# Patient Record
Sex: Male | Born: 1965 | Hispanic: No | Marital: Married | Smoking: Never smoker
Health system: Southern US, Community
[De-identification: ages and names within clinical notes are randomized; demographics above are authoritative.]

## PROBLEM LIST (undated history)

## (undated) DIAGNOSIS — R42 Dizziness and giddiness: Secondary | ICD-10-CM

## (undated) DIAGNOSIS — S31139A Puncture wound of abdominal wall without foreign body, unspecified quadrant without penetration into peritoneal cavity, initial encounter: Secondary | ICD-10-CM

## (undated) DIAGNOSIS — F4310 Post-traumatic stress disorder, unspecified: Secondary | ICD-10-CM

## (undated) DIAGNOSIS — R2689 Other abnormalities of gait and mobility: Secondary | ICD-10-CM

## (undated) DIAGNOSIS — I2699 Other pulmonary embolism without acute cor pulmonale: Secondary | ICD-10-CM

## (undated) DIAGNOSIS — W3400XA Accidental discharge from unspecified firearms or gun, initial encounter: Secondary | ICD-10-CM

## (undated) DIAGNOSIS — I10 Essential (primary) hypertension: Secondary | ICD-10-CM

## (undated) DIAGNOSIS — E119 Type 2 diabetes mellitus without complications: Secondary | ICD-10-CM

## (undated) HISTORY — DX: Dizziness and giddiness: R42

## (undated) HISTORY — DX: Other abnormalities of gait and mobility: R26.89

## (undated) HISTORY — DX: Essential (primary) hypertension: I10

## (undated) HISTORY — DX: Post-traumatic stress disorder, unspecified: F43.10

## (undated) HISTORY — PX: CARDIAC CATHETERIZATION: SHX172

---

## 2011-08-07 ENCOUNTER — Encounter (HOSPITAL_COMMUNITY): Payer: Self-pay | Admitting: Emergency Medicine

## 2011-08-07 ENCOUNTER — Emergency Department (HOSPITAL_COMMUNITY)
Admission: EM | Admit: 2011-08-07 | Discharge: 2011-08-07 | Disposition: A | Discharge status: Home / Self Care | Payer: Self-pay | Source: Home / Self Care | Attending: Emergency Medicine | Admitting: Emergency Medicine

## 2011-08-07 DIAGNOSIS — H7320 Unspecified myringitis, unspecified ear: Secondary | ICD-10-CM

## 2011-08-07 DIAGNOSIS — H7390 Unspecified disorder of tympanic membrane, unspecified ear: Secondary | ICD-10-CM

## 2011-08-07 DIAGNOSIS — H9311 Tinnitus, right ear: Secondary | ICD-10-CM

## 2011-08-07 DIAGNOSIS — H9319 Tinnitus, unspecified ear: Secondary | ICD-10-CM

## 2011-08-07 MED ORDER — CIPROFLOXACIN-DEXAMETHASONE 0.3-0.1 % OT SUSP: 4.0000 [drp] | Freq: Two times a day (BID) | OTIC | Status: AC

## 2011-08-07 NOTE — ED Provider Notes (Addendum)
History     CSN: [id number]  Arrival date & time 08/07/11  1714   First MD Initiated Contact with Patient 08/07/11 1717      Chief Complaint  Patient presents with  . Tinnitus    (Consider location/radiation/quality/duration/timing/severity/associated sxs/prior treatment) HPI Comments: 3 days with right ear tinnitus in minimal discomfort. Patient denies any direct injury or trauma to his ears or any ear drainage, denies any recent respiratory symptoms or allergies. Denies any fevers or pain.   Patient describes a for about 3 days he's been feeling this unusual sound emanating from his right ear. He is uncertain and has never experienced anything like this before. He also goes into explaining that he has not had any other symptoms such as vertigo, dizziness, nausea or vomiting. He thought initially that could be that his ear was filled with your legs and he was attempting to use peroxide ear drops which she used for 2-3 days. But the sensation in the discomfort persisted. Decided to come in today to be checked.  Wife describes a has been performing some you about exercise in which she blows air through his nose for minutes at a time subsequently.   The history is provided by the patient and the spouse.    Past Medical History  Diagnosis Date  . Diabetes mellitus     History reviewed. No pertinent past surgical history.  No family history on file.  History  Substance Use Topics  . Smoking status: Never Smoker   . Smokeless tobacco: Not on file  . Alcohol Use: Yes     OCASSIONALLY      Review of Systems  Constitutional: Negative for fever, chills and activity change.  HENT: Positive for tinnitus. Negative for hearing loss, nosebleeds, congestion, rhinorrhea, sneezing, neck pain, neck stiffness, dental problem, postnasal drip and sinus pressure.   Eyes: Negative for pain.  Cardiovascular: Negative for chest pain.  Neurological: Negative for dizziness, seizures, syncope,  speech difficulty, weakness, light-headedness, numbness and headaches.    Allergies  Review of patient's allergies indicates no known allergies.  Home Medications   Current Outpatient Rx  Name Route Sig Dispense Refill  . METFORMIN HCL 500 MG PO TABS Oral Take 500 mg by mouth 2 (two) times daily with a meal.    . CIPROFLOXACIN-DEXAMETHASONE 0.3-0.1 % OT SUSP Right Ear Place 4 drops into the right ear 2 (two) times daily. 7.5 mL 0    BP 135/81  Pulse 96  Temp 98.4 F (36.9 C) (Oral)  Resp 16  SpO2 97%  Physical Exam  Nursing note and vitals reviewed. Constitutional: He appears well-developed and well-nourished.  HENT:  Head: Normocephalic.  Right Ear: No lacerations. No drainage. No foreign bodies. No mastoid tenderness. Tympanic membrane is injected. Tympanic membrane is not scarred, not perforated, not retracted and not bulging. Tympanic membrane mobility is normal. No middle ear effusion. No decreased hearing is noted.  Ears:  Eyes: Conjunctivae are normal. Right eye exhibits no discharge. Left eye exhibits no discharge.  Neck: Neck supple. No JVD present.  Lymphadenopathy:    He has no cervical adenopathy.  Skin: No rash noted.    ED Course  Procedures (including critical care time)  Labs Reviewed - No data to display No results found.   1. Tinnitus of right ear   2. Tympanic membrane inflammation       MDM  Right ear tinnitus possibly secondary to secondary to eustachian tube dysfunction or para trauma related to recent your  exercise. Incidentally noted focal area of erythema in the eardrum suspect this is unrelated and incidental- will prescribed otic antibiotic topical (ear drops) x 7 days. Patient was instructed to followup with ENT Dr. if no improvement is noted in the next 5-7 days.    Jimmie Molly, MD 08/07/11 1930  Jimmie Molly, MD 08/07/11 7253
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
## 2011-08-07 NOTE — Discharge Instructions (Signed)
We discussed several things about this symptom, recommended you see the ear nose and throat doctor if no improvement after 7-10 days. Should discontinue or stop performing does your exercises that might be creating this problem.     Tinnitus Sounds you hear in your ears and coming from within the ear is called tinnitus. This can be a symptom of many ear disorders. It is often associated with hearing loss.  Tinnitus can be seen with:  Infections.   Ear blockages such as wax buildup.   Meniere's disease.   Ear damage.   Inherited.   Occupational causes.  While irritating, it is not usually a threat to health. When the cause of the tinnitus is wax, infection in the middle ear, or foreign body it is easily treated. Hearing loss will usually be reversible.  TREATMENT  When treating the underlying cause does not get rid of tinnitus, it may be necessary to get rid of the unwanted sound by covering it up with more pleasant background noises. This may include music, the radio etc. There are tinnitus maskers which can be worn which produce background noise to cover up the tinnitus. Avoid all medications which tend to make tinnitus worse such as alcohol, caffeine, aspirin, and nicotine. There are many soothing background tapes such as rain, ocean, thunderstorms, etc. These soothing sounds help with sleeping or resting. Keep all follow-up appointments and referrals. This is important to identify the cause of the problem. It also helps avoid complications, impaired hearing, disability, or chronic pain. Document Released: 02/03/2005 Document Revised: 01/23/2011 Document Reviewed: 09/22/2007 [facility] Patient Information 2012 [place], [place].

## 2012-02-18 DIAGNOSIS — S31139A Puncture wound of abdominal wall without foreign body, unspecified quadrant without penetration into peritoneal cavity, initial encounter: Secondary | ICD-10-CM

## 2012-02-18 HISTORY — DX: Puncture wound of abdominal wall without foreign body, unspecified quadrant without penetration into peritoneal cavity, initial encounter: S31.139A

## 2012-08-13 ENCOUNTER — Encounter (HOSPITAL_COMMUNITY): Payer: Self-pay | Admitting: Certified Registered Nurse Anesthetist

## 2012-08-13 ENCOUNTER — Emergency Department (HOSPITAL_COMMUNITY): Payer: BC Managed Care – PPO

## 2012-08-13 ENCOUNTER — Encounter (HOSPITAL_COMMUNITY): Admission: EM | Disposition: A | Discharge status: Home Health | Payer: Self-pay | Source: Home / Self Care

## 2012-08-13 ENCOUNTER — Inpatient Hospital Stay (HOSPITAL_COMMUNITY)
Admission: EM | Admit: 2012-08-13 | Discharge: 2012-08-24 | DRG: 585 | Disposition: A | Discharge status: Home Health | Payer: BC Managed Care – PPO | Attending: General Surgery | Admitting: General Surgery

## 2012-08-13 ENCOUNTER — Emergency Department (HOSPITAL_COMMUNITY): Payer: BC Managed Care – PPO | Admitting: Certified Registered Nurse Anesthetist

## 2012-08-13 DIAGNOSIS — T8140XA Infection following a procedure, unspecified, initial encounter: Secondary | ICD-10-CM | POA: Diagnosis not present

## 2012-08-13 DIAGNOSIS — Y838 Other surgical procedures as the cause of abnormal reaction of the patient, or of later complication, without mention of misadventure at the time of the procedure: Secondary | ICD-10-CM | POA: Diagnosis not present

## 2012-08-13 DIAGNOSIS — S36501A Unspecified injury of transverse colon, initial encounter: Principal | ICD-10-CM | POA: Diagnosis present

## 2012-08-13 DIAGNOSIS — S36408A Unspecified injury of other part of small intestine, initial encounter: Secondary | ICD-10-CM

## 2012-08-13 DIAGNOSIS — I498 Other specified cardiac arrhythmias: Secondary | ICD-10-CM | POA: Diagnosis present

## 2012-08-13 DIAGNOSIS — S36499A Other injury of unspecified part of small intestine, initial encounter: Secondary | ICD-10-CM

## 2012-08-13 DIAGNOSIS — D72829 Elevated white blood cell count, unspecified: Secondary | ICD-10-CM | POA: Diagnosis present

## 2012-08-13 DIAGNOSIS — E871 Hypo-osmolality and hyponatremia: Secondary | ICD-10-CM | POA: Diagnosis present

## 2012-08-13 DIAGNOSIS — S36509A Unspecified injury of unspecified part of colon, initial encounter: Secondary | ICD-10-CM

## 2012-08-13 DIAGNOSIS — S31139A Puncture wound of abdominal wall without foreign body, unspecified quadrant without penetration into peritoneal cavity, initial encounter: Secondary | ICD-10-CM

## 2012-08-13 DIAGNOSIS — J9600 Acute respiratory failure, unspecified whether with hypoxia or hypercapnia: Secondary | ICD-10-CM | POA: Diagnosis not present

## 2012-08-13 DIAGNOSIS — S36400A Unspecified injury of duodenum, initial encounter: Secondary | ICD-10-CM

## 2012-08-13 DIAGNOSIS — E119 Type 2 diabetes mellitus without complications: Secondary | ICD-10-CM | POA: Diagnosis present

## 2012-08-13 DIAGNOSIS — K659 Peritonitis, unspecified: Secondary | ICD-10-CM | POA: Diagnosis present

## 2012-08-13 DIAGNOSIS — S3681XA Injury of peritoneum, initial encounter: Secondary | ICD-10-CM | POA: Diagnosis present

## 2012-08-13 DIAGNOSIS — I959 Hypotension, unspecified: Secondary | ICD-10-CM | POA: Diagnosis present

## 2012-08-13 DIAGNOSIS — E876 Hypokalemia: Secondary | ICD-10-CM | POA: Diagnosis not present

## 2012-08-13 DIAGNOSIS — W3400XA Accidental discharge from unspecified firearms or gun, initial encounter: Secondary | ICD-10-CM

## 2012-08-13 DIAGNOSIS — D62 Acute posthemorrhagic anemia: Secondary | ICD-10-CM | POA: Diagnosis not present

## 2012-08-13 DIAGNOSIS — IMO0002 Reserved for concepts with insufficient information to code with codable children: Secondary | ICD-10-CM | POA: Diagnosis present

## 2012-08-13 HISTORY — DX: Type 2 diabetes mellitus without complications: E11.9

## 2012-08-13 HISTORY — PX: LAPAROTOMY: SHX154

## 2012-08-13 HISTORY — PX: BOWEL RESECTION: SHX1257

## 2012-08-13 LAB — CBC WITH DIFFERENTIAL/PLATELET
Basophils Absolute: 0 K/uL (ref 0.0–0.1)
Basophils Relative: 0 % (ref 0–1)
Eosinophils Absolute: 0.1 10*3/uL (ref 0.0–0.7)
Eosinophils Relative: 1 % (ref 0–5)
HCT: 39.6 % (ref 39.0–52.0)
Hemoglobin: 13.7 g/dL (ref 13.0–17.0)
Lymphocytes Relative: 19 % (ref 12–46)
Lymphs Abs: 2.3 K/uL (ref 0.7–4.0)
MCH: 30.2 pg (ref 26.0–34.0)
MCHC: 34.6 g/dL (ref 30.0–36.0)
MCV: 87.4 fL (ref 78.0–100.0)
Monocytes Absolute: 0.9 10*3/uL (ref 0.1–1.0)
Monocytes Relative: 7 % (ref 3–12)
Neutro Abs: 9.2 K/uL — ABNORMAL HIGH (ref 1.7–7.7)
Neutrophils Relative %: 73 % (ref 43–77)
Platelets: 262 K/uL (ref 150–400)
RBC: 4.53 MIL/uL (ref 4.22–5.81)
RDW: 12.8 % (ref 11.5–15.5)
WBC: 12.6 K/uL — ABNORMAL HIGH (ref 4.0–10.5)

## 2012-08-13 LAB — COMPREHENSIVE METABOLIC PANEL WITH GFR
ALT: 26 U/L (ref 0–53)
Albumin: 3.7 g/dL (ref 3.5–5.2)
Calcium: 8.6 mg/dL (ref 8.4–10.5)
GFR calc Af Amer: >90 mL/min (ref >90)
Glucose, Bld: 222 mg/dL — ABNORMAL HIGH (ref 70–99)
Sodium: 139 meq/L (ref 135–145)
Total Protein: 6.1 g/dL (ref 6.0–8.3)

## 2012-08-13 LAB — COMPREHENSIVE METABOLIC PANEL
AST: 26 U/L (ref 0–37)
Alkaline Phosphatase: 69 U/L (ref 39–117)
BUN: 13 mg/dL (ref 6–23)
CO2: 22 mEq/L (ref 19–32)
Chloride: 103 mEq/L (ref 96–112)
Creatinine, Ser: 0.87 mg/dL (ref 0.50–1.35)
GFR calc non Af Amer: >90 mL/min (ref >90)
Potassium: 4.2 mEq/L (ref 3.5–5.1)
Total Bilirubin: 0.2 mg/dL — ABNORMAL LOW (ref 0.3–1.2)

## 2012-08-13 LAB — POCT I-STAT, CHEM 8
BUN: 13 mg/dL (ref 6–23)
Calcium, Ion: 1.1 mmol/L — ABNORMAL LOW (ref 1.12–1.23)
Chloride: 103 meq/L (ref 96–112)
Creatinine, Ser: 0.9 mg/dL (ref 0.50–1.35)
Glucose, Bld: 230 mg/dL — ABNORMAL HIGH (ref 70–99)
HCT: 42 % (ref 39.0–52.0)
Hemoglobin: 14.3 g/dL (ref 13.0–17.0)
Potassium: 4.3 mEq/L (ref 3.5–5.1)
Sodium: 139 mEq/L (ref 135–145)
TCO2: 21 mmol/L (ref 0–100)

## 2012-08-13 LAB — PROTIME-INR
INR: 1.1 (ref 0.00–1.49)
Prothrombin Time: 14 s (ref 11.6–15.2)

## 2012-08-13 LAB — APTT: aPTT: 22 s — ABNORMAL LOW (ref 24–37)

## 2012-08-13 LAB — ABO/RH: ABO/RH(D): A POS

## 2012-08-13 SURGERY — LAPAROTOMY, EXPLORATORY
Anesthesia: General | Site: Abdomen

## 2012-08-13 MED ORDER — CEFAZOLIN SODIUM-DEXTROSE 2-3 GM-% IV SOLR
INTRAVENOUS | Status: DC | PRN
  Administered 2012-08-13: 2 g via INTRAVENOUS

## 2012-08-13 MED ORDER — ETOMIDATE 2 MG/ML IV SOLN
INTRAVENOUS | Status: DC | PRN
  Administered 2012-08-13: 20 mg via INTRAVENOUS

## 2012-08-13 MED ORDER — PROPOFOL 10 MG/ML IV EMUL
25.0000 ug/kg/min | INTRAVENOUS | Status: DC
  Filled 2012-08-13: qty 100

## 2012-08-13 MED ORDER — ROCURONIUM BROMIDE 100 MG/10ML IV SOLN
INTRAVENOUS | Status: DC | PRN
  Administered 2012-08-13: 15 mg via INTRAVENOUS
  Administered 2012-08-13: 25 mg via INTRAVENOUS
  Administered 2012-08-13: 20 mg via INTRAVENOUS
  Administered 2012-08-13: 10 mg via INTRAVENOUS

## 2012-08-13 MED ORDER — SODIUM CHLORIDE 0.9 % IV SOLN
INTRAVENOUS | Status: DC | PRN
  Administered 2012-08-13: 22:00:00 via INTRAVENOUS

## 2012-08-13 MED ORDER — 0.9 % SODIUM CHLORIDE (POUR BTL) OPTIME
TOPICAL | Status: DC | PRN
  Administered 2012-08-13: 4000 mL

## 2012-08-13 MED ORDER — SUCCINYLCHOLINE CHLORIDE 20 MG/ML IJ SOLN
INTRAMUSCULAR | Status: DC | PRN
  Administered 2012-08-13: 120 mg via INTRAVENOUS

## 2012-08-13 MED ORDER — LACTATED RINGERS IV SOLN
INTRAVENOUS | Status: DC | PRN
  Administered 2012-08-13 (×2): via INTRAVENOUS

## 2012-08-13 MED ORDER — FENTANYL CITRATE 0.05 MG/ML IJ SOLN
INTRAMUSCULAR | Status: DC | PRN
  Administered 2012-08-13 (×4): 50 ug via INTRAVENOUS
  Administered 2012-08-13 (×2): 100 ug via INTRAVENOUS
  Administered 2012-08-13: 50 ug via INTRAVENOUS
  Administered 2012-08-13 – 2012-08-14 (×2): 150 ug via INTRAVENOUS

## 2012-08-13 MED ORDER — PROPOFOL INFUSION 10 MG/ML OPTIME
INTRAVENOUS | Status: DC | PRN
  Administered 2012-08-13: 50 ug/kg/min via INTRAVENOUS

## 2012-08-13 MED ORDER — MIDAZOLAM HCL 5 MG/5ML IJ SOLN
INTRAMUSCULAR | Status: DC | PRN
  Administered 2012-08-13: 2 mg via INTRAVENOUS

## 2012-08-13 SURGICAL SUPPLY — 50 items
BLADE SURG ROTATE 9660 (MISCELLANEOUS) ×2 IMPLANT
CANISTER SUCTION 2500CC (MISCELLANEOUS) ×2 IMPLANT
CANISTER WOUND CARE 500ML ATS (WOUND CARE) ×2 IMPLANT
CHLORAPREP W/TINT 26ML (MISCELLANEOUS) IMPLANT
CLOTH BEACON ORANGE TIMEOUT ST (SAFETY) ×2 IMPLANT
COVER SURGICAL LIGHT HANDLE (MISCELLANEOUS) ×2 IMPLANT
DRAIN CHANNEL 19F RND (DRAIN) IMPLANT
DRAPE LAPAROSCOPIC ABDOMINAL (DRAPES) ×2 IMPLANT
DRAPE UTILITY 15X26 W/TAPE STR (DRAPE) ×4 IMPLANT
DRAPE WARM FLUID 44X44 (DRAPE) ×2 IMPLANT
ELECT BLADE 6.5 EXT (BLADE) ×2 IMPLANT
ELECT CAUTERY BLADE 6.4 (BLADE) ×2 IMPLANT
ELECT REM PT RETURN 9FT ADLT (ELECTROSURGICAL) ×2
ELECTRODE REM PT RTRN 9FT ADLT (ELECTROSURGICAL) ×1 IMPLANT
EVACUATOR SILICONE 100CC (DRAIN) IMPLANT
GLOVE BIO SURGEON STRL SZ7.5 (GLOVE) ×2 IMPLANT
GLOVE BIO SURGEON STRL SZ8 (GLOVE) ×4 IMPLANT
GLOVE BIOGEL PI IND STRL 6.5 (GLOVE) ×2 IMPLANT
GLOVE BIOGEL PI IND STRL 7.5 (GLOVE) ×1 IMPLANT
GLOVE BIOGEL PI IND STRL 8 (GLOVE) ×1 IMPLANT
GLOVE BIOGEL PI INDICATOR 6.5 (GLOVE) ×2
GLOVE BIOGEL PI INDICATOR 7.5 (GLOVE) ×1
GLOVE BIOGEL PI INDICATOR 8 (GLOVE) ×1
GLOVE SURG ORTHO 8.0 STRL STRW (GLOVE) ×2 IMPLANT
GLOVE SURG SS PI 6.0 STRL IVOR (GLOVE) ×4 IMPLANT
GOWN STRL NON-REIN LRG LVL3 (GOWN DISPOSABLE) ×4 IMPLANT
GOWN STRL REIN XL XLG (GOWN DISPOSABLE) ×4 IMPLANT
KIT BASIN OR (CUSTOM PROCEDURE TRAY) ×2 IMPLANT
KIT ROOM TURNOVER OR (KITS) ×2 IMPLANT
LIGASURE IMPACT 36 18CM CVD LR (INSTRUMENTS) ×2 IMPLANT
NS IRRIG 1000ML POUR BTL (IV SOLUTION) ×4 IMPLANT
PACK GENERAL/GYN (CUSTOM PROCEDURE TRAY) ×2 IMPLANT
PAD ARMBOARD 7.5X6 YLW CONV (MISCELLANEOUS) ×2 IMPLANT
RELOAD PROXIMATE 75MM BLUE (ENDOMECHANICALS) ×12 IMPLANT
SPECIMEN JAR LARGE (MISCELLANEOUS) IMPLANT
SPONGE ABDOMINAL VAC ABTHERA (MISCELLANEOUS) ×2 IMPLANT
SPONGE LAP 18X18 X RAY DECT (DISPOSABLE) ×8 IMPLANT
STAPLER GUN LINEAR PROX 60 (STAPLE) ×2 IMPLANT
STAPLER PROXIMATE 75MM BLUE (STAPLE) ×4 IMPLANT
STAPLER VISISTAT 35W (STAPLE) ×2 IMPLANT
SUCTION POOLE TIP (SUCTIONS) ×2 IMPLANT
SUT PDS AB 1 TP1 96 (SUTURE) IMPLANT
SUT SILK 2 0 SH CR/8 (SUTURE) ×6 IMPLANT
SUT SILK 2 0 TIES 10X30 (SUTURE) IMPLANT
SUT SILK 3 0 SH CR/8 (SUTURE) ×4 IMPLANT
SUT SILK 3 0 TIES 10X30 (SUTURE) IMPLANT
TOWEL OR 17X26 10 PK STRL BLUE (TOWEL DISPOSABLE) ×2 IMPLANT
TRAY FOLEY CATH 14FRSI W/METER (CATHETERS) ×2 IMPLANT
WATER STERILE IRR 1000ML POUR (IV SOLUTION) IMPLANT
YANKAUER SUCT BULB TIP NO VENT (SUCTIONS) ×2 IMPLANT
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
## 2012-08-13 NOTE — ED Notes (Signed)
X-ray at bedside
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
## 2012-08-13 NOTE — ED Notes (Signed)
[facility] END

## 2012-08-13 NOTE — ED Notes (Signed)
Pt transported to OR

## 2012-08-13 NOTE — Anesthesia Procedure Notes (Signed)
Procedure Name: Intubation Date/Time: 08/13/2012 9:44 PM Performed by: Julianne Rice Z Pre-anesthesia Checklist: Patient identified, Timeout performed, Emergency Drugs available, Suction available and Patient being monitored Patient Re-evaluated:Patient Re-evaluated prior to inductionOxygen Delivery Method: Circle system utilized Preoxygenation: Pre-oxygenation with 100% oxygen Intubation Type: IV induction, Rapid sequence and Cricoid Pressure applied Laryngoscope Size: Mac and 3 Grade View: Grade I Tube type: Oral Tube size: 8.0 mm Number of attempts: 1 Airway Equipment and Method: Stylet Placement Confirmation: ETT inserted through vocal cords under direct vision,  breath sounds checked- equal and bilateral and positive ETCO2 Secured at: 22 cm Tube secured with: Tape Dental Injury: Teeth and Oropharynx as per pre-operative assessment
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
## 2012-08-14 ENCOUNTER — Inpatient Hospital Stay (HOSPITAL_COMMUNITY): Payer: BC Managed Care – PPO

## 2012-08-14 DIAGNOSIS — S31109A Unspecified open wound of abdominal wall, unspecified quadrant without penetration into peritoneal cavity, initial encounter: Secondary | ICD-10-CM

## 2012-08-14 LAB — LIPASE, BLOOD: Lipase: 246 U/L — ABNORMAL HIGH (ref 11–59)

## 2012-08-14 LAB — POCT I-STAT 4, (NA,K, GLUC, HGB,HCT)
Glucose, Bld: 223 mg/dL — ABNORMAL HIGH (ref 70–99)
HCT: 27 % — ABNORMAL LOW (ref 39.0–52.0)
Hemoglobin: 9.2 g/dL — ABNORMAL LOW (ref 13.0–17.0)
Potassium: 4.2 meq/L (ref 3.5–5.1)

## 2012-08-14 LAB — BASIC METABOLIC PANEL
BUN: 12 mg/dL (ref 6–23)
BUN: 13 mg/dL (ref 6–23)
CO2: 23 mEq/L (ref 19–32)
Calcium: 7.3 mg/dL — ABNORMAL LOW (ref 8.4–10.5)
Chloride: 104 mEq/L (ref 96–112)
Glucose, Bld: 218 mg/dL — ABNORMAL HIGH (ref 70–99)
Glucose, Bld: 140 mg/dL — ABNORMAL HIGH (ref 70–99)
Potassium: 4.1 mEq/L (ref 3.5–5.1)
Potassium: 3.7 mEq/L (ref 3.5–5.1)
Sodium: 138 mEq/L (ref 135–145)

## 2012-08-14 LAB — BLOOD GAS, ARTERIAL
Acid-base deficit: 3.1 mmol/L — ABNORMAL HIGH (ref 0.0–2.0)
Acid-base deficit: 1.6 mmol/L (ref 0.0–2.0)
Bicarbonate: 22.4 mEq/L (ref 20.0–24.0)
Drawn by: 33234
FIO2: 1 %
FIO2: 0.4 %
MECHVT: 500 mL
O2 Saturation: 99.7 %
Patient temperature: 98.6
RATE: 15 resp/min
TCO2: 23.5 mmol/L (ref 0–100)
pCO2 arterial: 51.6 mmHg — ABNORMAL HIGH (ref 35.0–45.0)
pH, Arterial: 7.268 — ABNORMAL LOW (ref 7.350–7.450)

## 2012-08-14 LAB — PREPARE FRESH FROZEN PLASMA: Unit division: 0

## 2012-08-14 LAB — GLUCOSE, CAPILLARY
Glucose-Capillary: 112 mg/dL — ABNORMAL HIGH (ref 70–99)
Glucose-Capillary: 120 mg/dL — ABNORMAL HIGH (ref 70–99)

## 2012-08-14 LAB — POCT I-STAT 7, (LYTES, BLD GAS, ICA,H+H)
Acid-base deficit: 2 mmol/L (ref 0.0–2.0)
Calcium, Ion: 1.09 mmol/L — ABNORMAL LOW (ref 1.12–1.23)
O2 Saturation: 100 %
Potassium: 4 meq/L (ref 3.5–5.1)
Sodium: 138 meq/L (ref 135–145)
pCO2 arterial: 40.3 mmHg (ref 35.0–45.0)

## 2012-08-14 LAB — CBC
HCT: 37.1 % — ABNORMAL LOW (ref 39.0–52.0)
HCT: 35.5 % — ABNORMAL LOW (ref 39.0–52.0)
Hemoglobin: 12.9 g/dL — ABNORMAL LOW (ref 13.0–17.0)
Hemoglobin: 12.4 g/dL — ABNORMAL LOW (ref 13.0–17.0)
MCH: 30.3 pg (ref 26.0–34.0)
MCH: 29.4 pg (ref 26.0–34.0)
MCHC: 34.1 g/dL (ref 30.0–36.0)
MCV: 86.6 fL (ref 78.0–100.0)
MCV: 86.1 fL (ref 78.0–100.0)
Platelets: 156 10*3/uL (ref 150–400)
RBC: 4.26 MIL/uL (ref 4.22–5.81)
RDW: 13.3 % (ref 11.5–15.5)
WBC: 5.5 10*3/uL (ref 4.0–10.5)

## 2012-08-14 LAB — PROTIME-INR: INR: 1.19 (ref 0.00–1.49)

## 2012-08-14 LAB — AMYLASE: Amylase: 131 U/L — ABNORMAL HIGH (ref 0–105)

## 2012-08-14 LAB — MRSA PCR SCREENING: MRSA by PCR: NEGATIVE

## 2012-08-14 MED ORDER — PANTOPRAZOLE SODIUM 40 MG IV SOLR
40.0000 mg | Freq: Every day | INTRAVENOUS | Status: DC
  Administered 2012-08-14 – 2012-08-19 (×5): 40 mg via INTRAVENOUS
  Filled 2012-08-14 (×10): qty 40

## 2012-08-14 MED ORDER — PANTOPRAZOLE SODIUM 40 MG PO TBEC
40.0000 mg | DELAYED_RELEASE_TABLET | Freq: Every day | ORAL | Status: DC
  Administered 2012-08-20 – 2012-08-24 (×5): 40 mg via ORAL
  Filled 2012-08-14 (×5): qty 1

## 2012-08-14 MED ORDER — ACETAMINOPHEN 10 MG/ML IV SOLN
1000.0000 mg | Freq: Four times a day (QID) | INTRAVENOUS | Status: AC | PRN
  Administered 2012-08-14 – 2012-08-15 (×2): 1000 mg via INTRAVENOUS
  Filled 2012-08-14 (×2): qty 100

## 2012-08-14 MED ORDER — INSULIN ASPART 100 UNIT/ML ~~LOC~~ SOLN
0.0000 [IU] | SUBCUTANEOUS | Status: DC
  Administered 2012-08-14: 2 [IU] via SUBCUTANEOUS
  Administered 2012-08-15 – 2012-08-17 (×2): 1 [IU] via SUBCUTANEOUS

## 2012-08-14 MED ORDER — SODIUM CHLORIDE 0.9 % IV BOLUS (SEPSIS)
1000.0000 mL | Freq: Once | INTRAVENOUS | Status: AC
  Administered 2012-08-14: 1000 mL via INTRAVENOUS

## 2012-08-14 MED ORDER — SODIUM CHLORIDE 0.9 % IV SOLN
25.0000 ug/h | INTRAVENOUS | Status: DC
  Administered 2012-08-14: 50 ug/h via INTRAVENOUS
  Administered 2012-08-14: 150 ug/h via INTRAVENOUS
  Administered 2012-08-15: 11:00:00 via INTRAVENOUS
  Administered 2012-08-16: 125 ug/h via INTRAVENOUS
  Filled 2012-08-14 (×4): qty 50

## 2012-08-14 MED ORDER — FENTANYL CITRATE 0.05 MG/ML IJ SOLN
INTRAMUSCULAR | Status: AC
  Administered 2012-08-14: 100 ug
  Filled 2012-08-14: qty 2

## 2012-08-14 MED ORDER — CHLORHEXIDINE GLUCONATE 0.12 % MT SOLN
15.0000 mL | Freq: Two times a day (BID) | OROMUCOSAL | Status: DC
  Administered 2012-08-14 – 2012-08-17 (×6): 15 mL via OROMUCOSAL
  Filled 2012-08-14 (×8): qty 15

## 2012-08-14 MED ORDER — ONDANSETRON HCL 4 MG/2ML IJ SOLN: 4.0000 mg | Freq: Four times a day (QID) | INTRAMUSCULAR | Status: DC | PRN

## 2012-08-14 MED ORDER — PROPOFOL 10 MG/ML IV EMUL
5.0000 ug/kg/min | INTRAVENOUS | Status: DC
  Administered 2012-08-14 (×2): 50 ug/kg/min via INTRAVENOUS
  Administered 2012-08-14: 35 ug/kg/min via INTRAVENOUS
  Administered 2012-08-14: 20 ug/kg/min via INTRAVENOUS
  Administered 2012-08-14: 25 ug/kg/min via INTRAVENOUS
  Administered 2012-08-15: 11:00:00 via INTRAVENOUS
  Administered 2012-08-15: 40 ug/kg/min via INTRAVENOUS
  Administered 2012-08-15: 50 ug/kg/min via INTRAVENOUS
  Administered 2012-08-16 (×2): 30 ug/kg/min via INTRAVENOUS
  Filled 2012-08-14 (×8): qty 100

## 2012-08-14 MED ORDER — CHLORHEXIDINE GLUCONATE 0.12 % MT SOLN
OROMUCOSAL | Status: AC
  Administered 2012-08-14: 15 mL
  Filled 2012-08-14: qty 15

## 2012-08-14 MED ORDER — FENTANYL BOLUS VIA INFUSION
25.0000 ug | Freq: Four times a day (QID) | INTRAVENOUS | Status: DC | PRN
  Administered 2012-08-15 (×6): 100 ug via INTRAVENOUS
  Filled 2012-08-14: qty 100

## 2012-08-14 MED ORDER — ONDANSETRON HCL 4 MG PO TABS: 4.0000 mg | ORAL_TABLET | Freq: Four times a day (QID) | ORAL | Status: DC | PRN

## 2012-08-14 MED ORDER — POTASSIUM CHLORIDE 2 MEQ/ML IV SOLN
INTRAVENOUS | Status: DC
  Administered 2012-08-14 – 2012-08-15 (×4): via INTRAVENOUS
  Filled 2012-08-14 (×6): qty 1000

## 2012-08-14 MED ORDER — BIOTENE DRY MOUTH MT LIQD
15.0000 mL | Freq: Four times a day (QID) | OROMUCOSAL | Status: DC
  Administered 2012-08-15 – 2012-08-19 (×18): 15 mL via OROMUCOSAL

## 2012-08-14 NOTE — Progress Notes (Signed)
Patient ID: Ray Clark, male   DOB: January 28, 1966, 47 y.o.   MRN: [id number] I spoke to his wife at the bedside and updated her on his clinical situation amd the plan for further surgery. Violeta Gelinas, MD, MPH, FACS Pager: [phone]
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
## 2012-08-14 NOTE — Transfer of Care (Signed)
Immediate Anesthesia Transfer of Care Note  Patient: Ray Clark  Procedure(s) Performed: Procedure(s): EXPLORATORY LAPAROTOMY (N/A) SMALL BOWEL RESECTION (N/A)  Patient Location: ICU and NICU  Anesthesia Type:General  Level of Consciousness: Patient remains intubated per anesthesia plan  Airway & Oxygen Therapy: Patient remains intubated per anesthesia plan and Patient placed on Ventilator (see vital sign flow sheet for setting)  Post-op Assessment: Post -op Vital signs reviewed and stable  Post vital signs: Reviewed and stable  Complications: No apparent anesthesia complications
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
## 2012-08-15 ENCOUNTER — Encounter (HOSPITAL_COMMUNITY): Payer: Self-pay | Admitting: Anesthesiology

## 2012-08-15 ENCOUNTER — Inpatient Hospital Stay (HOSPITAL_COMMUNITY): Payer: BC Managed Care – PPO | Admitting: Anesthesiology

## 2012-08-15 ENCOUNTER — Encounter (HOSPITAL_COMMUNITY): Admission: EM | Disposition: A | Discharge status: Home Health | Payer: Self-pay | Source: Home / Self Care

## 2012-08-15 ENCOUNTER — Encounter (HOSPITAL_COMMUNITY): Payer: Self-pay | Admitting: *Deleted

## 2012-08-15 DIAGNOSIS — J95821 Acute postprocedural respiratory failure: Secondary | ICD-10-CM

## 2012-08-15 DIAGNOSIS — E871 Hypo-osmolality and hyponatremia: Secondary | ICD-10-CM

## 2012-08-15 DIAGNOSIS — E876 Hypokalemia: Secondary | ICD-10-CM

## 2012-08-15 HISTORY — PX: LAPAROTOMY: SHX154

## 2012-08-15 HISTORY — PX: COLOSTOMY: SHX63

## 2012-08-15 HISTORY — PX: GASTROJEJUNOSTOMY: SHX1697

## 2012-08-15 LAB — POCT I-STAT 7, (LYTES, BLD GAS, ICA,H+H)
Calcium, Ion: 1.09 mmol/L — ABNORMAL LOW (ref 1.12–1.23)
Hemoglobin: 11.6 g/dL — ABNORMAL LOW (ref 13.0–17.0)
O2 Saturation: 97 %
Patient temperature: 104.8
Potassium: 3.9 meq/L (ref 3.5–5.1)
pCO2 arterial: 37.9 mmHg (ref 35.0–45.0)
pH, Arterial: 7.312 — ABNORMAL LOW (ref 7.350–7.450)

## 2012-08-15 LAB — CBC
HCT: 28.2 % — ABNORMAL LOW (ref 39.0–52.0)
Hemoglobin: 9.9 g/dL — ABNORMAL LOW (ref 13.0–17.0)
MCHC: 35.1 g/dL (ref 30.0–36.0)
MCV: 87 fL (ref 78.0–100.0)
RDW: 13.5 % (ref 11.5–15.5)

## 2012-08-15 LAB — BASIC METABOLIC PANEL
BUN: 12 mg/dL (ref 6–23)
Creatinine, Ser: 0.8 mg/dL (ref 0.50–1.35)
GFR calc Af Amer: >90 mL/min (ref >90)
GFR calc non Af Amer: >90 mL/min (ref >90)
Glucose, Bld: 107 mg/dL — ABNORMAL HIGH (ref 70–99)

## 2012-08-15 LAB — GLUCOSE, CAPILLARY
Glucose-Capillary: 103 mg/dL — ABNORMAL HIGH (ref 70–99)
Glucose-Capillary: 114 mg/dL — ABNORMAL HIGH (ref 70–99)
Glucose-Capillary: 86 mg/dL (ref 70–99)
Glucose-Capillary: 93 mg/dL (ref 70–99)

## 2012-08-15 LAB — POCT I-STAT 3, ART BLOOD GAS (G3+)
Patient temperature: 104.8
pH, Arterial: 7.29 — ABNORMAL LOW (ref 7.350–7.450)

## 2012-08-15 SURGERY — LAPAROTOMY, EXPLORATORY
Anesthesia: General | Site: Abdomen | Wound class: Clean Contaminated

## 2012-08-15 MED ORDER — METOPROLOL TARTRATE 1 MG/ML IV SOLN
5.0000 mg | INTRAVENOUS | Status: DC | PRN
  Administered 2012-08-15: 5 mg via INTRAVENOUS
  Filled 2012-08-15: qty 5

## 2012-08-15 MED ORDER — DEXTROSE 5 % IV SOLN
1.0000 g | Freq: Four times a day (QID) | INTRAVENOUS | Status: DC
  Administered 2012-08-15: 1 g via INTRAVENOUS
  Filled 2012-08-15: qty 1

## 2012-08-15 MED ORDER — SODIUM CHLORIDE 0.9 % IJ SOLN
10.0000 mL | INTRAMUSCULAR | Status: DC | PRN
  Administered 2012-08-20 – 2012-08-21 (×3): 10 mL

## 2012-08-15 MED ORDER — LACTATED RINGERS IV SOLN
INTRAVENOUS | Status: DC | PRN
  Administered 2012-08-15: 10:00:00 via INTRAVENOUS

## 2012-08-15 MED ORDER — ACETAMINOPHEN 10 MG/ML IV SOLN
1000.0000 mg | Freq: Four times a day (QID) | INTRAVENOUS | Status: AC
  Administered 2012-08-15 – 2012-08-16 (×4): 1000 mg via INTRAVENOUS
  Filled 2012-08-15 (×4): qty 100

## 2012-08-15 MED ORDER — SODIUM CHLORIDE 0.9 % IV BOLUS (SEPSIS)
1000.0000 mL | Freq: Once | INTRAVENOUS | Status: AC
  Administered 2012-08-15: 1000 mL via INTRAVENOUS

## 2012-08-15 MED ORDER — SODIUM CHLORIDE 0.9 % IV SOLN
INTRAVENOUS | Status: DC
  Administered 2012-08-15 – 2012-08-22 (×14): via INTRAVENOUS
  Filled 2012-08-15 (×22): qty 1000

## 2012-08-15 MED ORDER — POTASSIUM CHLORIDE 10 MEQ/100ML IV SOLN
10.0000 meq | INTRAVENOUS | Status: AC
  Administered 2012-08-15 (×2): 10 meq via INTRAVENOUS
  Filled 2012-08-15 (×2): qty 100

## 2012-08-15 MED ORDER — CEFAZOLIN SODIUM 1-5 GM-% IV SOLN
INTRAVENOUS | Status: AC
  Filled 2012-08-15: qty 100

## 2012-08-15 MED ORDER — PIPERACILLIN-TAZOBACTAM 3.375 G IVPB
3.3750 g | Freq: Three times a day (TID) | INTRAVENOUS | Status: AC
  Administered 2012-08-15 – 2012-08-22 (×21): 3.375 g via INTRAVENOUS
  Filled 2012-08-15 (×22): qty 50

## 2012-08-15 MED ORDER — PHENYLEPHRINE HCL 10 MG/ML IJ SOLN
10.0000 mg | INTRAVENOUS | Status: DC | PRN
  Administered 2012-08-15: 40 ug/min via INTRAVENOUS

## 2012-08-15 MED ORDER — METOPROLOL TARTRATE 1 MG/ML IV SOLN: 5.0000 mg | Freq: Once | INTRAVENOUS | Status: AC

## 2012-08-15 MED ORDER — MIDAZOLAM HCL 5 MG/5ML IJ SOLN
INTRAMUSCULAR | Status: DC | PRN
  Administered 2012-08-15: 4 mg via INTRAVENOUS

## 2012-08-15 MED ORDER — METOPROLOL TARTRATE 1 MG/ML IV SOLN
INTRAVENOUS | Status: AC
  Administered 2012-08-15: 5 mg via INTRAVENOUS
  Filled 2012-08-15: qty 5

## 2012-08-15 MED ORDER — VECURONIUM BROMIDE 10 MG IV SOLR
INTRAVENOUS | Status: DC | PRN
  Administered 2012-08-15 (×3): 5 mg via INTRAVENOUS

## 2012-08-15 MED ORDER — METHOCARBAMOL 100 MG/ML IJ SOLN
1000.0000 mg | Freq: Three times a day (TID) | INTRAVENOUS | Status: DC | PRN
  Administered 2012-08-15: 1000 mg via INTRAVENOUS
  Filled 2012-08-15: qty 10

## 2012-08-15 SURGICAL SUPPLY — 48 items
BANDAGE GAUZE ELAST BULKY 4 IN (GAUZE/BANDAGES/DRESSINGS) ×2 IMPLANT
BLADE SURG ROTATE 9660 (MISCELLANEOUS) IMPLANT
CANISTER SUCTION 2500CC (MISCELLANEOUS) ×4 IMPLANT
CHLORAPREP W/TINT 26ML (MISCELLANEOUS) IMPLANT
CLOTH BEACON ORANGE TIMEOUT ST (SAFETY) ×2 IMPLANT
COVER SURGICAL LIGHT HANDLE (MISCELLANEOUS) ×2 IMPLANT
DRAIN CHANNEL 19F RND (DRAIN) ×4 IMPLANT
DRAPE LAPAROSCOPIC ABDOMINAL (DRAPES) ×2 IMPLANT
DRAPE UTILITY 15X26 W/TAPE STR (DRAPE) ×4 IMPLANT
DRAPE WARM FLUID 44X44 (DRAPE) ×2 IMPLANT
ELECT BLADE 6.5 EXT (BLADE) ×2 IMPLANT
ELECT CAUTERY BLADE 6.4 (BLADE) ×2 IMPLANT
ELECT REM PT RETURN 9FT ADLT (ELECTROSURGICAL) ×2
ELECTRODE REM PT RTRN 9FT ADLT (ELECTROSURGICAL) ×1 IMPLANT
EVACUATOR SILICONE 100CC (DRAIN) ×4 IMPLANT
GLOVE BIO SURGEON STRL SZ8 (GLOVE) ×2 IMPLANT
GLOVE BIOGEL PI IND STRL 8 (GLOVE) ×1 IMPLANT
GLOVE BIOGEL PI INDICATOR 8 (GLOVE) ×1
GLOVE SURG SIGNA 7.5 PF LTX (GLOVE) ×2 IMPLANT
GOWN STRL NON-REIN LRG LVL3 (GOWN DISPOSABLE) ×4 IMPLANT
GOWN STRL REIN XL XLG (GOWN DISPOSABLE) ×4 IMPLANT
KIT BASIN OR (CUSTOM PROCEDURE TRAY) ×2 IMPLANT
KIT OSTOMY DRAINABLE 2.75 STR (WOUND CARE) ×2 IMPLANT
KIT ROOM TURNOVER OR (KITS) ×2 IMPLANT
LIGASURE IMPACT 36 18CM CVD LR (INSTRUMENTS) ×2 IMPLANT
NS IRRIG 1000ML POUR BTL (IV SOLUTION) ×8 IMPLANT
PACK GENERAL/GYN (CUSTOM PROCEDURE TRAY) ×2 IMPLANT
PAD ARMBOARD 7.5X6 YLW CONV (MISCELLANEOUS) ×2 IMPLANT
SPECIMEN JAR LARGE (MISCELLANEOUS) IMPLANT
SPONGE GAUZE 4X4 12PLY (GAUZE/BANDAGES/DRESSINGS) ×4 IMPLANT
SPONGE LAP 18X18 X RAY DECT (DISPOSABLE) IMPLANT
STAPLER GUN LINEAR PROX 60 (STAPLE) ×2 IMPLANT
STAPLER PROXIMATE 75MM BLUE (STAPLE) ×2 IMPLANT
STAPLER VISISTAT 35W (STAPLE) ×2 IMPLANT
SUCTION POOLE TIP (SUCTIONS) ×2 IMPLANT
SUT ETHILON 2 0 FS 18 (SUTURE) ×4 IMPLANT
SUT PDS AB 1 TP1 96 (SUTURE) ×6 IMPLANT
SUT SILK 2 0 SH CR/8 (SUTURE) ×6 IMPLANT
SUT SILK 2 0 TIES 10X30 (SUTURE) ×2 IMPLANT
SUT SILK 3 0 SH CR/8 (SUTURE) ×2 IMPLANT
SUT SILK 3 0 TIES 10X30 (SUTURE) ×2 IMPLANT
SUT VIC AB 3-0 SH 18 (SUTURE) ×2 IMPLANT
TAPE CLOTH SURG 4X10 WHT LF (GAUZE/BANDAGES/DRESSINGS) ×4 IMPLANT
TAPE UMBILICAL COTTON 1/8X30 (MISCELLANEOUS) ×4 IMPLANT
TOWEL OR 17X26 10 PK STRL BLUE (TOWEL DISPOSABLE) ×2 IMPLANT
TRAY FOLEY CATH 14FRSI W/METER (CATHETERS) IMPLANT
WATER STERILE IRR 1000ML POUR (IV SOLUTION) IMPLANT
YANKAUER SUCT BULB TIP NO VENT (SUCTIONS) IMPLANT
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
## 2012-08-15 NOTE — Anesthesia Postprocedure Evaluation (Signed)
  Anesthesia Post-op Note  Patient: Ray Clark  Procedure(s) Performed: Procedure(s): EXPLORATORY LAPAROTOMY (N/A) GASTROJEJUNOSTOMY (N/A) COLOSTOMY (N/A)  Patient Location: ICU  Anesthesia Type:General  Level of Consciousness: sedated, unresponsive and Patient remains intubated per anesthesia plan  Airway and Oxygen Therapy: Patient remains intubated per anesthesia plan  Post-op Pain: sedated unable to evaluate  Post-op Assessment: Post-op Vital signs reviewed, Patient's Cardiovascular Status Stable, Respiratory Function Stable, Patent Airway and No signs of Nausea or vomiting  Post-op Vital Signs: Reviewed and stable  Complications: No apparent anesthesia complications
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
## 2012-08-15 NOTE — Progress Notes (Signed)
Patient placed on cpap;psv 5/5-patient done well but did not leave on this because patient is going back to OR this am

## 2012-08-15 NOTE — Progress Notes (Signed)
Patient ID: Ray Clark, male   DOB: 1965/10/21, 47 y.o.   MRN: [id number] Patient has high fever.  I asked anesthesia to evaluate and they have R/O malignant hyperthermia. ABG and K are OK. Will give IV tylenol and zosyn.  Change to temp foley. Use cooling blanket. Violeta Gelinas, MD, MPH, FACS Pager: [phone]
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
## 2012-08-15 NOTE — Progress Notes (Signed)
Pt noted to be anxious with c/o increased abdominal pain at approx 0020.  Pt remains on propofol and fentanyl infusions and was given bolus doses of each to provide anxiety/pain relief.  SBP increased to 190s and HR to 150.  Pt's pain improved gradually over the next 20 minutes but his SBP decreased to 70s while his HR maintained 130s.  Propofol and fentanyl drips paused temporarily until pt's SBP returns to baseline.   MD Magnus Ivan) notified and order given for pt to receive NS 1 liter bolus over one hour.  Will continue to monitor.

## 2012-08-15 NOTE — Preoperative (Addendum)
Beta Blockers   Reason not to administer Beta Blockers:Pt. not on beta blocker 
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
## 2012-08-15 NOTE — Progress Notes (Signed)
Patient received from OR and placed back on vent. RT will continue to monitor
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
## 2012-08-15 NOTE — Progress Notes (Signed)
Peripherally Inserted Central Catheter/Midline Placement  The IV Nurse has discussed with the patient and/or persons authorized to consent for the patient, the purpose of this procedure and the potential benefits and risks involved with this procedure.  The benefits include less needle sticks, lab draws from the catheter and patient may be discharged home with the catheter.  Risks include, but not limited to, infection, bleeding, blood clot (thrombus formation), and puncture of an artery; nerve damage and irregular heat beat.  Alternatives to this procedure were also discussed.  PICC/Midline Placement Documentation  PICC Triple Lumen 08/15/12 PICC Right Basilic (Active)  Indication for Insertion or Continuance of Line Prolonged intravenous therapies 08/15/2012  5:25 PM  Length mark (cm) 1 cm 08/15/2012  5:25 PM  Site Assessment Clean;Dry;Intact 08/15/2012  5:25 PM  Lumen #1 Status Flushed;Saline locked;Blood return noted 08/15/2012  5:25 PM  Lumen #2 Status Flushed;Saline locked;Blood return noted 08/15/2012  5:25 PM  Lumen #3 Status Flushed;Saline locked;Blood return noted 08/15/2012  5:25 PM  Dressing Type Transparent 08/15/2012  5:25 PM  Dressing Status Clean;Dry;Intact;Antimicrobial disc in place 08/15/2012  5:25 PM  Line Care Connections checked and tightened 08/15/2012  5:25 PM  Dressing Intervention New dressing 08/15/2012  5:25 PM  Dressing Change Due 08/22/12 08/15/2012  5:25 PM       Geoffery Spruce 08/15/2012, 5:26 PM

## 2012-08-15 NOTE — Transfer of Care (Signed)
Immediate Anesthesia Transfer of Care Note  Patient: Ray Clark  Procedure(s) Performed: Procedure(s): EXPLORATORY LAPAROTOMY (N/A) GASTROJEJUNOSTOMY (N/A) COLOSTOMY (N/A)  Patient Location: ICU  Anesthesia Type:General  Level of Consciousness: sedated, unresponsive and Patient remains intubated per anesthesia plan  Airway & Oxygen Therapy: Patient remains intubated per anesthesia plan and Patient placed on Ventilator (see vital sign flow sheet for setting)  Post-op Assessment: Report given to PACU RN and Post -op Vital signs reviewed and stable  Post vital signs: Reviewed and stable  Complications: No apparent anesthesia complications

## 2012-08-15 NOTE — Anesthesia Procedure Notes (Signed)
Date/Time: 08/15/2012 9:40 AM Performed by: Alanda Amass A Pre-anesthesia Checklist: Patient identified, Timeout performed, Emergency Drugs available, Suction available and Patient being monitored Patient Re-evaluated:Patient Re-evaluated prior to inductionOxygen Delivery Method: Circle system utilized Intubation Type: Inhalational induction with existing ETT

## 2012-08-16 ENCOUNTER — Inpatient Hospital Stay (HOSPITAL_COMMUNITY): Payer: BC Managed Care – PPO

## 2012-08-16 ENCOUNTER — Encounter (HOSPITAL_COMMUNITY): Payer: Self-pay | Admitting: General Surgery

## 2012-08-16 LAB — CBC
Hemoglobin: 10.5 g/dL — ABNORMAL LOW (ref 13.0–17.0)
MCHC: 35.2 g/dL (ref 30.0–36.0)
WBC: 4.6 10*3/uL (ref 4.0–10.5)

## 2012-08-16 LAB — POCT I-STAT 3, ART BLOOD GAS (G3+)
O2 Saturation: 98 %
pCO2 arterial: 36.2 mmHg (ref 35.0–45.0)
pO2, Arterial: 124 mmHg — ABNORMAL HIGH (ref 80.0–100.0)

## 2012-08-16 LAB — TYPE AND SCREEN
Unit division: 0
Unit division: 0

## 2012-08-16 LAB — BASIC METABOLIC PANEL
Chloride: 105 mEq/L (ref 96–112)
GFR calc Af Amer: >90 mL/min (ref >90)
GFR calc non Af Amer: >90 mL/min (ref >90)
Glucose, Bld: 120 mg/dL — ABNORMAL HIGH (ref 70–99)
Potassium: 3.7 mEq/L (ref 3.5–5.1)
Sodium: 132 mEq/L — ABNORMAL LOW (ref 135–145)

## 2012-08-16 LAB — GLUCOSE, CAPILLARY
Glucose-Capillary: 101 mg/dL — ABNORMAL HIGH (ref 70–99)
Glucose-Capillary: 103 mg/dL — ABNORMAL HIGH (ref 70–99)

## 2012-08-16 MED ORDER — MORPHINE SULFATE 2 MG/ML IJ SOLN
INTRAMUSCULAR | Status: AC
  Administered 2012-08-16: 2 mg via INTRAVENOUS
  Filled 2012-08-16: qty 1

## 2012-08-16 MED ORDER — MORPHINE SULFATE 2 MG/ML IJ SOLN
2.0000 mg | INTRAMUSCULAR | Status: DC | PRN
  Administered 2012-08-16 (×2): 4 mg via INTRAVENOUS
  Administered 2012-08-16 – 2012-08-17 (×9): 2 mg via INTRAVENOUS
  Administered 2012-08-17 (×2): 4 mg via INTRAVENOUS
  Administered 2012-08-17 – 2012-08-18 (×6): 2 mg via INTRAVENOUS
  Administered 2012-08-18: 4 mg via INTRAVENOUS
  Filled 2012-08-16 (×3): qty 1
  Filled 2012-08-16: qty 2
  Filled 2012-08-16 (×2): qty 1
  Filled 2012-08-16: qty 2
  Filled 2012-08-16 (×5): qty 1
  Filled 2012-08-16 (×2): qty 2
  Filled 2012-08-16: qty 1
  Filled 2012-08-16 (×2): qty 2
  Filled 2012-08-16 (×3): qty 1

## 2012-08-16 MED ORDER — SODIUM BICARBONATE 8.4 % IV SOLN
INTRAVENOUS | Status: AC
  Administered 2012-08-16: 50 meq via INTRAVENOUS
  Filled 2012-08-16: qty 50

## 2012-08-16 MED ORDER — SODIUM BICARBONATE 8.4 % IV SOLN: 50.0000 meq | Freq: Once | INTRAVENOUS | Status: AC

## 2012-08-16 MED ORDER — ACETAMINOPHEN 10 MG/ML IV SOLN
1000.0000 mg | Freq: Four times a day (QID) | INTRAVENOUS | Status: AC
  Administered 2012-08-16 – 2012-08-17 (×4): 1000 mg via INTRAVENOUS
  Filled 2012-08-16 (×7): qty 100

## 2012-08-16 MED ORDER — SODIUM CHLORIDE 0.9 % IV SOLN
500.0000 mL | Freq: Once | INTRAVENOUS | Status: AC
  Administered 2012-08-16: 500 mL via INTRAVENOUS

## 2012-08-16 NOTE — Progress Notes (Signed)
UR completed 
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
## 2012-08-16 NOTE — Procedures (Signed)
Extubation Procedure Note  Patient Details:   Name: Ray Clark DOB: [birth year] MRN: [id number]   Airway Documentation:     Evaluation  O2 sats: stable throughout Complications: No apparent complications Patient did tolerate procedure well. Bilateral Breath Sounds: Clear Suctioning: Airway Yes  Pt tolerated wean well, positive for cuff leak, extubated to 4lpm [place]. No stridor or dyspnea noted after extubation. Pt appearing more comfortable at this time, and all vitals are within normal limits. RT will continue to monitor.   Beatris Si 08/16/2012, 10:16 AM

## 2012-08-17 ENCOUNTER — Encounter (HOSPITAL_COMMUNITY): Payer: Self-pay | Admitting: General Surgery

## 2012-08-17 DIAGNOSIS — R5381 Other malaise: Secondary | ICD-10-CM

## 2012-08-17 DIAGNOSIS — J9600 Acute respiratory failure, unspecified whether with hypoxia or hypercapnia: Secondary | ICD-10-CM | POA: Diagnosis present

## 2012-08-17 DIAGNOSIS — D62 Acute posthemorrhagic anemia: Secondary | ICD-10-CM | POA: Diagnosis not present

## 2012-08-17 DIAGNOSIS — S36408A Unspecified injury of other part of small intestine, initial encounter: Secondary | ICD-10-CM

## 2012-08-17 DIAGNOSIS — S36509A Unspecified injury of unspecified part of colon, initial encounter: Secondary | ICD-10-CM

## 2012-08-17 DIAGNOSIS — W3400XA Accidental discharge from unspecified firearms or gun, initial encounter: Secondary | ICD-10-CM

## 2012-08-17 DIAGNOSIS — E119 Type 2 diabetes mellitus without complications: Secondary | ICD-10-CM

## 2012-08-17 DIAGNOSIS — S36400A Unspecified injury of duodenum, initial encounter: Secondary | ICD-10-CM

## 2012-08-17 DIAGNOSIS — S31139A Puncture wound of abdominal wall without foreign body, unspecified quadrant without penetration into peritoneal cavity, initial encounter: Secondary | ICD-10-CM

## 2012-08-17 LAB — CBC WITH DIFFERENTIAL/PLATELET
Eosinophils Relative: 3 % (ref 0–5)
HCT: 27.1 % — ABNORMAL LOW (ref 39.0–52.0)
Hemoglobin: 9.5 g/dL — ABNORMAL LOW (ref 13.0–17.0)
Lymphocytes Relative: 11 % — ABNORMAL LOW (ref 12–46)
Lymphs Abs: 0.5 10*3/uL — ABNORMAL LOW (ref 0.7–4.0)
MCV: 85.5 fL (ref 78.0–100.0)
Monocytes Absolute: 0.6 10*3/uL (ref 0.1–1.0)
Monocytes Relative: 13 % — ABNORMAL HIGH (ref 3–12)
Neutro Abs: 3.3 10*3/uL (ref 1.7–7.7)
RBC: 3.17 MIL/uL — ABNORMAL LOW (ref 4.22–5.81)
RDW: 13.4 % (ref 11.5–15.5)
WBC: 4.6 10*3/uL (ref 4.0–10.5)

## 2012-08-17 LAB — BASIC METABOLIC PANEL
BUN: 10 mg/dL (ref 6–23)
CO2: 21 mEq/L (ref 19–32)
Calcium: 7.4 mg/dL — ABNORMAL LOW (ref 8.4–10.5)
Chloride: 108 mEq/L (ref 96–112)
Creatinine, Ser: 0.64 mg/dL (ref 0.50–1.35)
Glucose, Bld: 120 mg/dL — ABNORMAL HIGH (ref 70–99)

## 2012-08-17 LAB — GLUCOSE, CAPILLARY
Glucose-Capillary: 104 mg/dL — ABNORMAL HIGH (ref 70–99)
Glucose-Capillary: 106 mg/dL — ABNORMAL HIGH (ref 70–99)
Glucose-Capillary: 111 mg/dL — ABNORMAL HIGH (ref 70–99)
Glucose-Capillary: 88 mg/dL (ref 70–99)

## 2012-08-17 LAB — TRIGLYCERIDES: Triglycerides: 205 mg/dL — ABNORMAL HIGH (ref <150)
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
## 2012-08-18 DIAGNOSIS — E119 Type 2 diabetes mellitus without complications: Secondary | ICD-10-CM | POA: Insufficient documentation

## 2012-08-18 LAB — GLUCOSE, CAPILLARY
Glucose-Capillary: 123 mg/dL — ABNORMAL HIGH (ref 70–99)
Glucose-Capillary: 109 mg/dL — ABNORMAL HIGH (ref 70–99)
Glucose-Capillary: 111 mg/dL — ABNORMAL HIGH (ref 70–99)
Glucose-Capillary: 121 mg/dL — ABNORMAL HIGH (ref 70–99)
Glucose-Capillary: 109 mg/dL — ABNORMAL HIGH (ref 70–99)

## 2012-08-18 LAB — CBC
HCT: 25.9 % — ABNORMAL LOW (ref 39.0–52.0)
MCHC: 34.7 g/dL (ref 30.0–36.0)
MCV: 85.8 fL (ref 78.0–100.0)
Platelets: 185 10*3/uL (ref 150–400)
RDW: 13.4 % (ref 11.5–15.5)
WBC: 5.3 10*3/uL (ref 4.0–10.5)

## 2012-08-18 LAB — BASIC METABOLIC PANEL
BUN: 12 mg/dL (ref 6–23)
Chloride: 110 mEq/L (ref 96–112)
Creatinine, Ser: 0.59 mg/dL (ref 0.50–1.35)
GFR calc Af Amer: >90 mL/min (ref >90)
GFR calc non Af Amer: >90 mL/min (ref >90)
Potassium: 3.7 mEq/L (ref 3.5–5.1)

## 2012-08-18 MED ORDER — SODIUM CHLORIDE 0.9 % IJ SOLN
INTRAMUSCULAR | Status: AC
  Administered 2012-08-18: 10:00:00
  Filled 2012-08-18: qty 10

## 2012-08-18 NOTE — Progress Notes (Signed)
Physical Therapy Treatment Patient Details Name: Ray Clark MRN: [id number] DOB: [birth year] Today's Date: 08/18/2012 Time: 4098-1191 PT Time Calculation (min): 31 min  PT Assessment / Plan / Recommendation  PT Comments   Patient progressing with increased ambulation distance and working on posture today.  Feel should be able to try stairs once has fewer lines.  Wife present and eager to assist during session.  Follow Up Recommendations  Home health PT;Supervision/Assistance - 24 hour     Does the patient have the potential to tolerate intense rehabilitation   N/A  Barriers to Discharge  Stairs to living area in home      Equipment Recommendations  Rolling walker with 5" wheels    Recommendations for Other Services  None  Frequency Min 3X/week   Progress towards PT Goals Progress towards PT goals: Progressing toward goals  Plan Current plan remains appropriate    Precautions / Restrictions Precautions Precautions: Fall Precaution Comments: colostomy, drains, NG no longer to suction   Pertinent Vitals/Pain 4/10 in abdomen    Mobility  Bed Mobility Bed Mobility: Rolling Right;Right Sidelying to Sit;Sitting - Scoot to Edge of Bed Rolling Right: With rail;4: Min assist Right Sidelying to Sit: 3: Mod assist;HOB flat;With rails Sitting - Scoot to Edge of Bed: 3: Mod assist Details for Bed Mobility Assistance: cues for technique, increased time and assist to lift trunk and scoot Transfers Sit to Stand: 4: Min assist;From bed;With upper extremity assist Stand to Sit: 4: Min assist;With armrests;To chair/3-in-1 Details for Transfer Assistance: cues for hand placement Ambulation/Gait Ambulation/Gait Assistance: 4: Min guard Ambulation Distance (Feet): 75 Feet Assistive device: Rolling walker Ambulation/Gait Assistance Details: cues for forward gaze, upright posture as tolerated Gait Pattern: Trunk flexed;Step-through pattern;Decreased stride length    Exercises Other  Exercises Other Exercises: seated chin tucks with head support 5 second hold x 5 reps.    PT Goals (current goals can now be found in the care plan section)    Visit Information  Last PT Received On: 08/18/12    Subjective Data   Able to state level of pain, but still reluctant to speak.   Cognition  Cognition Arousal/Alertness: Awake/alert Behavior During Therapy: WFL for tasks assessed/performed Overall Cognitive Status: Within Functional Limits for tasks assessed    Balance  Static Standing Balance Static Standing - Balance Support: Bilateral upper extremity supported Static Standing - Level of Assistance: 5: Stand by assistance Static Standing - Comment/# of Minutes: standing to rest frequently during ambulation  End of Session PT - End of Session Activity Tolerance: Patient limited by fatigue Patient left: in chair;with call bell/phone within reach;with family/visitor present Nurse Communication: Mobility status   GP     [facility] 08/18/2012, 1:55 PM [place], PT [phone] 08/18/2012

## 2012-08-18 NOTE — Progress Notes (Signed)
OT Cancellation Note  Patient Details Name: Ray Clark MRN: [id number] DOB: September 03, 1965   Cancelled Treatment:    Reason Eval/Treat Not Completed: Pain limiting ability to participate;Fatigue/lethargy limiting ability to participate. Will re-attempt next date.  08/18/2012 Cipriano Mile OTR/L Pager [phone] Office [phone]
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
## 2012-08-18 NOTE — Progress Notes (Signed)
Trauma Service Note  Subjective: Patient is in good spirits.  Ostomy is working.  Objective: Vital signs in last 24 hours: Temp:  [98.1 F (36.7 C)-99.5 F (37.5 C)] 98.1 F (36.7 C) (07/02 0750) Pulse Rate:  [89-103] 94 (07/02 0750) Resp:  [20-35] 31 (07/02 0750) BP: (112-129)/(72-80) 120/72 mmHg (07/02 0750) SpO2:  [95 %-100 %] 100 % (07/02 0750) Last BM Date: 08/16/12  Intake/Output from previous day: 07/01 0701 - 07/02 0700 In: 3310 [I.V.:3000; NG/GT:60; IV Piggyback:250] Out: 3054 [Urine:1625; Emesis/NG output:800; Drains:479; Stool:150] Intake/Output this shift: Total I/O In: 125 [I.V.:125] Out: 690 [Urine:500; Emesis/NG output:100; Drains:90]  General: No acute distress  Lungs: Clear to auscultation  Abd: Some bowel sounds.   Ostomy output is okay.  Bilious.  Extremities: No DVT clinical signs or symptoms.  Neuro: Intact  Lab Results: CBC   Recent Labs  08/17/12 0521 08/18/12 0422  WBC 4.6 5.3  HGB 9.5* 9.0*  HCT 27.1* 25.9*  PLT 164 185   BMET  Recent Labs  08/17/12 0521 08/18/12 0422  NA 138 140  K 3.6 3.7  CL 108 110  CO2 21 20  GLUCOSE 120* 124*  BUN 10 12  CREATININE 0.64 0.59  CALCIUM 7.4* 7.4*   PT/INR No results found for this basename: LABPROT, INR,  in the last 72 hours ABG  Recent Labs  08/15/12 1757 08/16/12 0928  PHART 7.312* 7.312*  HCO3 18.5* 18.2*    Studies/Results: No results found.  Anti-infectives: Anti-infectives   Start     Dose/Rate Route Frequency Ordered Stop   08/15/12 1900  piperacillin-tazobactam (ZOSYN) IVPB 3.375 g     3.375 g 12.5 mL/hr over 240 Minutes Intravenous Every 8 hours 08/15/12 1757     08/15/12 1200  cefOXitin (MEFOXIN) 1 g in dextrose 5 % 50 mL IVPB  Status:  Discontinued     1 g 100 mL/hr over 30 Minutes Intravenous 4 times per day 08/15/12 0957 08/15/12 1109      Assessment/Plan: s/p Procedure(s): EXPLORATORY LAPAROTOMY GASTROJEJUNOSTOMY COLOSTOMY DC NGT, keep on ice  chips Decrease IVF rate. No UGI study needed.  LOS: 5 days   Marta Lamas. Gae Bon, MD, FACS [phone] Trauma Surgeon 08/18/2012

## 2012-08-18 NOTE — Consult Note (Signed)
WOC consult Note Reason for Consult: evaluation for [facility] placement Wound type:surgical Measurement:19cm x 3cm x 0.5cm  Wound bed: subcutaneous fat, some sutures visible, clean and pink Drainage (amount, consistency, odor) minimal serosanguinous  Periwound:intact Dressing procedure/placement/frequency:1pc of black granufoam cut to fit into the wound bed, drape and seal at , pt premedicated by bedside nurse just prior to dressing change. Pt tolerated without problems.   WOC ostomy consult  Stoma type/location: RLQ, end ileostomy Stomal assessment/size: 2" round, with some slough noted from 11-5 oclock, slightly budded from the skin Peristomal assessment: intact Treatment options for stomal/peristomal skin: added 2" barrier ring since the stoma is only slightly budded Output liquid green-brown Ostomy pouching: 1pc with 2" barrier ring due to proximity to the wound and the need for added barrier ring, this will be more flexible.   Answered questions at the bedside on ostomy and wound earlier today with wife present. Will work with wife on ostomy care per the patients request.   WOC team will follow along with you for wound and ostomy care and teaching. Kwanza Cancelliere [place] RN,CWOCN [phone]

## 2012-08-18 NOTE — Progress Notes (Signed)
NUTRITION FOLLOW UP  Intervention:    Diet advancement as able per MD. Recommend Regular diet with Ensure Complete PO TID to maximize oral intake of protein and calories.  Nutrition Dx:   Inadequate oral intake related to inability to eat as evidenced by NPO status. Ongoing.  Goal:   Intake to meet >90% of estimated nutrition needs. Unmet.  Monitor:   Diet advancement, weight trend, labs.  Assessment:   S/P exploratory laparotomy with pyloric exclusion, gastrojejunostomy, colostomy 6/29. Remains NPO except ice chips and water. NGT being d/c'ed today. Colostomy output is bilious. Patient with increased nutrient needs to support healing and recovery.  Height: Ht Readings from Last 1 Encounters:  08/14/12 5\' 5"  (1.651 m)    Weight Status:  stable Wt Readings from Last 1 Encounters:  08/16/12 160 lb 4.4 oz (72.7 kg)  08/13/12  160 lb (72.576 kg)  Re-estimated needs:  Kcal: 2100-2300 Protein: 110-130 gm Fluid: 2.1-2.3 L  Skin: surgical abdominal incisions  Diet Order: NPO   Intake/Output Summary (Last 24 hours) at 08/18/12 1121 Last data filed at 08/18/12 0800  Gross per 24 hour  Intake   2855 ml  Output   3115 ml  Net   -260 ml    Last BM: 6/30   Labs:   Recent Labs Lab 08/16/12 0525 08/17/12 0521 08/18/12 0422  NA 132* 138 140  K 3.7 3.6 3.7  CL 105 108 110  CO2 19 21 20   BUN 10 10 12   CREATININE 0.66 0.64 0.59  CALCIUM 7.0* 7.4* 7.4*  GLUCOSE 120* 120* 124*    CBG (last 3)   Recent Labs  08/17/12 2330 08/18/12 0314 08/18/12 0752  GLUCAP 111* 111* 105*    Scheduled Meds: . antiseptic oral rinse  15 mL Mouth Rinse QID  . insulin aspart  0-9 Units Subcutaneous Q4H  . pantoprazole  40 mg Oral Daily   Or  . pantoprazole (PROTONIX) IV  40 mg Intravenous Daily  . piperacillin-tazobactam (ZOSYN)  IV  3.375 g Intravenous Q8H  . sodium chloride        Continuous Infusions: . sodium chloride 0.9 % 1,000 mL with potassium chloride 20 mEq  infusion 75 mL/hr at 08/18/12 0940     Joaquin Courts, RD, LDN, CNSC Pager [phone] After Hours Pager [phone]

## 2012-08-19 ENCOUNTER — Encounter (HOSPITAL_COMMUNITY): Payer: Self-pay | Admitting: Acute Care

## 2012-08-19 LAB — GLUCOSE, CAPILLARY
Glucose-Capillary: 126 mg/dL — ABNORMAL HIGH (ref 70–99)
Glucose-Capillary: 153 mg/dL — ABNORMAL HIGH (ref 70–99)

## 2012-08-19 MED ORDER — HYDROMORPHONE HCL PF 1 MG/ML IJ SOLN: 0.5000 mg | INTRAMUSCULAR | Status: DC | PRN

## 2012-08-19 MED ORDER — IBUPROFEN 400 MG PO TABS
400.0000 mg | ORAL_TABLET | Freq: Three times a day (TID) | ORAL | Status: DC | PRN
  Administered 2012-08-19: 400 mg via ORAL
  Filled 2012-08-19: qty 1

## 2012-08-19 MED ORDER — ENOXAPARIN SODIUM 40 MG/0.4ML ~~LOC~~ SOLN
40.0000 mg | SUBCUTANEOUS | Status: DC
  Administered 2012-08-19 – 2012-08-24 (×6): 40 mg via SUBCUTANEOUS
  Filled 2012-08-19 (×6): qty 0.4

## 2012-08-19 MED ORDER — HYDROCODONE-ACETAMINOPHEN 10-325 MG PO TABS
0.5000 | ORAL_TABLET | ORAL | Status: DC | PRN
  Administered 2012-08-19 – 2012-08-24 (×6): 1 via ORAL
  Filled 2012-08-19: qty 1
  Filled 2012-08-19: qty 2
  Filled 2012-08-19 (×4): qty 1

## 2012-08-19 MED ORDER — METFORMIN HCL 500 MG PO TABS
500.0000 mg | ORAL_TABLET | Freq: Three times a day (TID) | ORAL | Status: DC
Start: 2012-08-19 — End: 2012-08-24
  Administered 2012-08-19 – 2012-08-24 (×16): 500 mg via ORAL
  Filled 2012-08-19 (×17): qty 1

## 2012-08-19 MED ORDER — INSULIN ASPART 100 UNIT/ML ~~LOC~~ SOLN
0.0000 [IU] | Freq: Three times a day (TID) | SUBCUTANEOUS | Status: DC
  Administered 2012-08-19 – 2012-08-22 (×7): 1 [IU] via SUBCUTANEOUS
  Administered 2012-08-23 (×3): 2 [IU] via SUBCUTANEOUS
  Administered 2012-08-24: 1 [IU] via SUBCUTANEOUS
  Administered 2012-08-24 (×2): 2 [IU] via SUBCUTANEOUS

## 2012-08-19 NOTE — Consult Note (Signed)
WOC ostomy consult  Stoma type/location: RLQ, end ileostomy  Stomal assessment/size: 2" round Peristomal assessment: intact  Treatment options for stomal/peristomal skin: added 2" barrier ring since the stoma is only slightly budded  Output liquid green-brown and flatus Ostomy pouching: 1pc with 2" barrier ring due to proximity to the wound and the need for added barrier ring, this will be more flexible.  Answered questions at the bedside on ostomy, provided educational materials for pt and his wife.  Planned pouch change in the am with wife and patient.   WOC will follow along with you Armen Pick[place] RN,CWOCN [phone]

## 2012-08-19 NOTE — Progress Notes (Signed)
UR completed 

## 2012-08-19 NOTE — Progress Notes (Signed)
Notified Dr. Janee Morn of pt's elevated temperature of 102.2 at 2148 and that he had received one Vicodin about one hour prior to elevated temp. Received orders for Motrin and a CBC in the am.

## 2012-08-19 NOTE — Progress Notes (Signed)
PT Cancellation Note  Patient Details Name: Del Overfelt MRN: [id number] DOB: 04/01/1965   Cancelled Treatment:    Reason Eval/Treat Not Completed: Fatigue/lethargy limiting ability to participate; observed patient ambulating in hall with nursing assist, then transferred to 6N.  Will see pt tomorrow.   Thierno Hun,CYNDI 08/19/2012, 4:38 PM
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
## 2012-08-19 NOTE — Progress Notes (Signed)
Pt transferred to 6N29 per MD order. Report called to receiving nurse and all questions answered.
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
## 2012-08-19 NOTE — Progress Notes (Signed)
Patient doing much better.  Transfer to the floor appropriate.  This patient has been seen and I agree with the findings and treatment plan.  Marta Lamas. Gae Bon, MD, FACS [phone] (pager) [phone] (direct pager) Trauma Surgeon

## 2012-08-19 NOTE — Progress Notes (Signed)
Foley d/c'd at 1130, pt tolerated well. Will continue to monitor.

## 2012-08-20 LAB — GLUCOSE, CAPILLARY
Glucose-Capillary: 121 mg/dL — ABNORMAL HIGH (ref 70–99)
Glucose-Capillary: 118 mg/dL — ABNORMAL HIGH (ref 70–99)

## 2012-08-20 LAB — CBC
Hemoglobin: 10.2 g/dL — ABNORMAL LOW (ref 13.0–17.0)
MCHC: 35.1 g/dL (ref 30.0–36.0)
Platelets: 263 10*3/uL (ref 150–400)
RDW: 12.8 % (ref 11.5–15.5)

## 2012-08-20 NOTE — Consult Note (Signed)
WOC ostomy follow up  Stoma type/location: RLQ, end ileostomy Stomal assessment/size: 2" oval, with some sloughing Peristomal assessment: intact Treatment options for stomal/peristomal skin: 2" barrier ring to aid in seal of pouch Output liquid green Ostomy pouching: 1pc.with barrier ring Education provided: extensive teaching session with pt and his wife. Demonstrated cutting new pouch and placement of pouch with 2" barrier ring placed around the stoma.   Will enroll in [place] [employer] Start dc program and order more supplies for patient room. Ryllie Nieland [employer] RN,CWOCN [phone]
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
## 2012-08-21 LAB — CBC WITH DIFFERENTIAL/PLATELET
Basophils Absolute: 0 10*3/uL (ref 0.0–0.1)
Basophils Relative: 0 % (ref 0–1)
Eosinophils Absolute: 0.3 10*3/uL (ref 0.0–0.7)
MCH: 29.4 pg (ref 26.0–34.0)
MCHC: 34.3 g/dL (ref 30.0–36.0)
Neutrophils Relative %: 73 % (ref 43–77)
Platelets: 318 10*3/uL (ref 150–400)
RBC: 3.27 MIL/uL — ABNORMAL LOW (ref 4.22–5.81)
RDW: 12.9 % (ref 11.5–15.5)

## 2012-08-21 LAB — GLUCOSE, CAPILLARY: Glucose-Capillary: 150 mg/dL — ABNORMAL HIGH (ref 70–99)
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
## 2012-08-22 LAB — GLUCOSE, CAPILLARY
Glucose-Capillary: 124 mg/dL — ABNORMAL HIGH (ref 70–99)
Glucose-Capillary: 147 mg/dL — ABNORMAL HIGH (ref 70–99)
Glucose-Capillary: 153 mg/dL — ABNORMAL HIGH (ref 70–99)

## 2012-08-22 LAB — CBC WITH DIFFERENTIAL/PLATELET
Basophils Relative: 1 % (ref 0–1)
Eosinophils Relative: 3 % (ref 0–5)
Hemoglobin: 9.8 g/dL — ABNORMAL LOW (ref 13.0–17.0)
Lymphs Abs: 1.1 10*3/uL (ref 0.7–4.0)
MCH: 30 pg (ref 26.0–34.0)
MCV: 86.5 fL (ref 78.0–100.0)
Monocytes Absolute: 1.1 10*3/uL — ABNORMAL HIGH (ref 0.1–1.0)
Monocytes Relative: 12 % (ref 3–12)
RBC: 3.27 MIL/uL — ABNORMAL LOW (ref 4.22–5.81)
WBC: 9.2 10*3/uL (ref 4.0–10.5)
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
## 2012-08-22 NOTE — Progress Notes (Signed)
Did educational session with pt and wife on ostomy and JP drain care.  Wife demonstrated how to close bottom of ostomy bag correctly.  Did complete ostomy bag change with ring using a one piece [place] [phone].  Explained skin prep, use of ring, and placement of bag.  Wife very receptive to caring for pt and ostomy.  They have watched the "Ostomy Care at Home" video.  Suggested next bag change to take video with her phone so she can have it as a resource.

## 2012-08-23 LAB — CBC WITH DIFFERENTIAL/PLATELET
Eosinophils Relative: 2 % (ref 0–5)
Hemoglobin: 9.4 g/dL — ABNORMAL LOW (ref 13.0–17.0)
Lymphocytes Relative: 11 % — ABNORMAL LOW (ref 12–46)
Lymphs Abs: 1.1 10*3/uL (ref 0.7–4.0)
MCV: 85.7 fL (ref 78.0–100.0)
Monocytes Relative: 10 % (ref 3–12)
Platelets: 443 10*3/uL — ABNORMAL HIGH (ref 150–400)
RBC: 3.14 MIL/uL — ABNORMAL LOW (ref 4.22–5.81)
WBC: 10.4 10*3/uL (ref 4.0–10.5)

## 2012-08-23 LAB — GLUCOSE, CAPILLARY
Glucose-Capillary: 152 mg/dL — ABNORMAL HIGH (ref 70–99)
Glucose-Capillary: 157 mg/dL — ABNORMAL HIGH (ref 70–99)

## 2012-08-23 MED ORDER — AMOXICILLIN-POT CLAVULANATE 875-125 MG PO TABS
1.0000 | ORAL_TABLET | Freq: Two times a day (BID) | ORAL | Status: DC
  Administered 2012-08-23 – 2012-08-24 (×2): 1 via ORAL
  Filled 2012-08-23 (×3): qty 1
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
## 2012-08-23 NOTE — Consult Note (Signed)
WOC follow up note Pt with RLQ ostomy, pouch changed over the weekend by nursing staff. They did not aim pouch downward so this make empty more difficult. Would suggest with first [facility] pouch change to aim downward to facilitate better ability of the patient to empty his pouch.  May have wound closed today per surgery and not go home with VAC.  He did have lots of questions for me about his and I have assured him surgery will make best decision for closure when they evaluate.   Ordered extra ostomy supplies (1pc flat and 2" barrier ring), high volume of liquid output.  Enrolled in [place] Secure Start discharge program  [place] RN,CWOCN [phone]
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
## 2012-08-23 NOTE — Progress Notes (Signed)
Discontinued both JP drains but was only suppose to discontinue JP drain #2. PA was notified.

## 2012-08-23 NOTE — Progress Notes (Signed)
Delivered rolling walker to patient's room.  Patient did not want tub bench due to insurance not covering it.  Stated he would like to visit our store to shop around for other options after leaving the hospital.    Orlan Leavens [phone]

## 2012-08-23 NOTE — Progress Notes (Signed)
Delayed primary closure of wound today.  Ordered to have JP with low output removed but nursing removed both.  Will monitor abdominal exam until tomorrow.  I do not think we will need to replace it. I also spoke to his family. Patient examined and I agree with the assessment and plan  Violeta Gelinas, MD, MPH, FACS Pager: [phone]  08/23/2012 11:22 AM

## 2012-08-23 NOTE — Progress Notes (Signed)
Physical Therapy Treatment Patient Details Name: Ray Clark MRN: [id number] DOB: 1965-08-03 Today's Date: 08/23/2012 Time: 1340-1420 PT Time Calculation (min): 40 min  PT Assessment / Plan / Recommendation  PT Comments   Pt progressing well with mobility, very nice man. Able to do 8 steps with supervision (limited by IV and wound vac), feel confident pt will not have difficulty with home entry. Recommended to pt's mother to have chair available at top of steps for pt to use for rest. Pt's mother practiced supervising pt during mobility, she is very supportive and able to provide 24 hour assistance.   Follow Up Recommendations  Home health PT;Supervision/Assistance - 24 hour           Equipment Recommendations  Rolling walker with 5" wheels       Frequency Min 3X/week   Progress towards PT Goals Progress towards PT goals: Progressing toward goals  Plan Current plan remains appropriate    Precautions / Restrictions Precautions Precautions: Fall Precaution Comments: colostomy   Pertinent Vitals/Pain "It's not too bad", pt able to tolerate pain in abdomen today.    Mobility  Bed Mobility Bed Mobility: Sitting - Scoot to Edge of Bed;Right Sidelying to Sit Rolling Right: 5: Supervision Right Sidelying to Sit: HOB flat;5: Supervision Sitting - Scoot to Edge of Bed: 5: Supervision Details for Bed Mobility Assistance: cues for technique, increased time and assist to lift trunk and scoot Transfers Transfers: Sit to Stand;Stand to Sit Sit to Stand: With upper extremity assist;From bed;5: Supervision;From chair/3-in-1 Stand to Sit: To chair/3-in-1;5: Supervision;To bed Details for Transfer Assistance: cues for hand placement, good control Ambulation/Gait Ambulation/Gait Assistance: 5: Supervision Ambulation Distance (Feet): 600 Feet Assistive device: Rolling walker Ambulation/Gait Assistance Details: Slow steady, slightly forward flexed. Cues for upright posture and educated on  upright posture to avoid scar tissue contraction of abdomen.  Gait Pattern: Trunk flexed;Step-through pattern;Decreased stride length Stairs: Yes Stairs Assistance: 5: Supervision Stairs Assistance Details (indicate cue type and reason): Cues for safety. Pt performs smoothly and easily with supervision as precautions. Limited with # of steps due to IV and wound vac. Mother and pt encouraged to have a chair at foot and top of steps once home for pt to rest in.  Stair Management Technique: One rail Right;Step to pattern;Forwards Number of Stairs: 8 Wheelchair Mobility Wheelchair Mobility: No    Exercises Other Exercises Other Exercises: standing "stretches" to full upright position. Pt encouraged to lie all the way flat multiple times during the day to decrease risk of scar tissue adherence in abdomen.     PT Goals (current goals can now be found in the care plan section) Acute Rehab PT Goals Patient Stated Goal: to return home PT Goal Formulation: With patient/family Time For Goal Achievement: 08/31/12 Potential to Achieve Goals: Good  Visit Information  Last PT Received On: 08/23/12 Assistance Needed: +1    Subjective Data  Patient Stated Goal: to return home   Cognition  Cognition Arousal/Alertness: Awake/alert Behavior During Therapy: WFL for tasks assessed/performed Overall Cognitive Status: Within Functional Limits for tasks assessed       End of Session PT - End of Session Activity Tolerance: Patient tolerated treatment well Patient left: in chair;with call bell/phone within reach;with family/visitor present (pt encouraged to eat) Nurse Communication: Mobility status   GP     Wilhemina Bonito 08/23/2012, 2:29 PM

## 2012-08-24 LAB — CBC
Hemoglobin: 10.4 g/dL — ABNORMAL LOW (ref 13.0–17.0)
RBC: 3.47 MIL/uL — ABNORMAL LOW (ref 4.22–5.81)

## 2012-08-24 LAB — GLUCOSE, CAPILLARY: Glucose-Capillary: 161 mg/dL — ABNORMAL HIGH (ref 70–99)

## 2012-08-24 MED ORDER — AMOXICILLIN-POT CLAVULANATE 875-125 MG PO TABS: 1.0000 | ORAL_TABLET | Freq: Two times a day (BID) | ORAL | Status: DC

## 2012-08-24 MED ORDER — HYDROCODONE-ACETAMINOPHEN 5-325 MG PO TABS: 1.0000 | ORAL_TABLET | ORAL | Status: DC | PRN
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
## 2012-08-24 NOTE — Discharge Summary (Signed)
Ray Schnyder, MD, MPH, FACS Pager: [phone]  

## 2012-08-24 NOTE — Progress Notes (Signed)
Looks better today.  Abdominal exam is quite benign. Ready for D/C today with HHRN and DMEs. Patient examined and I agree with the assessment and plan  Violeta Gelinas, MD, MPH, FACS Pager: [phone]  08/24/2012 10:29 AM

## 2012-08-24 NOTE — Progress Notes (Signed)
PT Cancellation Note  Patient Details Name: Ray Clark MRN: [id number] DOB: [birth year]   Cancelled Treatment:    Reason Eval/Treat Not Completed: Patient discharging today. Discussed d/c and patient denies any further concerns with mobility. Reports he walked with his family this morning and feels confident with bed mobility and transfers. He declines practicing any this pm.    [facility] HELEN 08/24/2012, 2:59 PM Pager: [phone]
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
## 2012-08-25 ENCOUNTER — Telehealth (INDEPENDENT_AMBULATORY_CARE_PROVIDER_SITE_OTHER): Payer: Self-pay | Admitting: General Surgery

## 2012-08-25 NOTE — Telephone Encounter (Signed)
Ray Clark, Nurse with [facility] called concerning orders after D/C. Nurse states the Referal states the Wound Vac was D/C in the hospital. Nurse states they have Wound Vac supplies in the home. Nurse would like clarification on whether to go back to the Wound Vac or go to W/D Dressings. Please advise.

## 2012-08-26 ENCOUNTER — Encounter (HOSPITAL_COMMUNITY): Payer: Self-pay | Admitting: Emergency Medicine

## 2012-08-26 LAB — WOUND CULTURE: Culture: NO GROWTH

## 2012-08-26 NOTE — Telephone Encounter (Signed)
He needs wet to dry dressings as he had a small wound infection. Thanks

## 2012-08-27 ENCOUNTER — Telehealth (INDEPENDENT_AMBULATORY_CARE_PROVIDER_SITE_OTHER): Payer: Self-pay | Admitting: General Surgery

## 2012-08-27 NOTE — Telephone Encounter (Signed)
[facility] called and stated that the patient wife does not want [facility] to come out to do PT on the patient that he is walking around the house.

## 2012-08-28 ENCOUNTER — Telehealth (INDEPENDENT_AMBULATORY_CARE_PROVIDER_SITE_OTHER): Payer: Self-pay | Admitting: General Surgery

## 2012-08-28 ENCOUNTER — Inpatient Hospital Stay (HOSPITAL_COMMUNITY)
Admission: EM | Admit: 2012-08-28 | Discharge: 2012-09-06 | DRG: 078 | Disposition: A | Discharge status: Home / Self Care | Payer: BC Managed Care – PPO | Attending: General Surgery | Admitting: General Surgery

## 2012-08-28 ENCOUNTER — Emergency Department (HOSPITAL_COMMUNITY): Payer: BC Managed Care – PPO

## 2012-08-28 ENCOUNTER — Encounter (HOSPITAL_COMMUNITY): Payer: Self-pay | Admitting: Emergency Medicine

## 2012-08-28 DIAGNOSIS — I2699 Other pulmonary embolism without acute cor pulmonale: Secondary | ICD-10-CM | POA: Diagnosis present

## 2012-08-28 DIAGNOSIS — R5381 Other malaise: Secondary | ICD-10-CM | POA: Diagnosis present

## 2012-08-28 DIAGNOSIS — K3184 Gastroparesis: Secondary | ICD-10-CM | POA: Diagnosis present

## 2012-08-28 DIAGNOSIS — E440 Moderate protein-calorie malnutrition: Secondary | ICD-10-CM | POA: Diagnosis present

## 2012-08-28 DIAGNOSIS — N1330 Unspecified hydronephrosis: Secondary | ICD-10-CM | POA: Diagnosis present

## 2012-08-28 DIAGNOSIS — K560 Paralytic ileus: Secondary | ICD-10-CM | POA: Diagnosis present

## 2012-08-28 DIAGNOSIS — E860 Dehydration: Secondary | ICD-10-CM | POA: Diagnosis present

## 2012-08-28 DIAGNOSIS — R1013 Epigastric pain: Secondary | ICD-10-CM

## 2012-08-28 DIAGNOSIS — K912 Postsurgical malabsorption, not elsewhere classified: Secondary | ICD-10-CM | POA: Diagnosis present

## 2012-08-28 DIAGNOSIS — R066 Hiccough: Secondary | ICD-10-CM | POA: Diagnosis present

## 2012-08-28 DIAGNOSIS — D62 Acute posthemorrhagic anemia: Secondary | ICD-10-CM | POA: Diagnosis present

## 2012-08-28 DIAGNOSIS — K8020 Calculus of gallbladder without cholecystitis without obstruction: Secondary | ICD-10-CM | POA: Diagnosis present

## 2012-08-28 DIAGNOSIS — Z9049 Acquired absence of other specified parts of digestive tract: Secondary | ICD-10-CM

## 2012-08-28 DIAGNOSIS — R509 Fever, unspecified: Secondary | ICD-10-CM | POA: Diagnosis present

## 2012-08-28 DIAGNOSIS — E119 Type 2 diabetes mellitus without complications: Secondary | ICD-10-CM | POA: Diagnosis present

## 2012-08-28 DIAGNOSIS — Y833 Surgical operation with formation of external stoma as the cause of abnormal reaction of the patient, or of later complication, without mention of misadventure at the time of the procedure: Secondary | ICD-10-CM | POA: Diagnosis present

## 2012-08-28 DIAGNOSIS — R112 Nausea with vomiting, unspecified: Secondary | ICD-10-CM | POA: Diagnosis present

## 2012-08-28 DIAGNOSIS — Z933 Colostomy status: Secondary | ICD-10-CM

## 2012-08-28 DIAGNOSIS — IMO0002 Reserved for concepts with insufficient information to code with codable children: Secondary | ICD-10-CM

## 2012-08-28 DIAGNOSIS — Y9210 Unspecified residential institution as the place of occurrence of the external cause: Secondary | ICD-10-CM | POA: Diagnosis present

## 2012-08-28 DIAGNOSIS — R188 Other ascites: Secondary | ICD-10-CM | POA: Diagnosis present

## 2012-08-28 HISTORY — DX: Accidental discharge from unspecified firearms or gun, initial encounter: W34.00XA

## 2012-08-28 HISTORY — DX: Puncture wound of abdominal wall without foreign body, unspecified quadrant without penetration into peritoneal cavity, initial encounter: S31.139A

## 2012-08-28 LAB — COMPREHENSIVE METABOLIC PANEL
AST: 44 U/L — ABNORMAL HIGH (ref 0–37)
Albumin: 2.8 g/dL — ABNORMAL LOW (ref 3.5–5.2)
CO2: 28 mEq/L (ref 19–32)
Calcium: 8.9 mg/dL (ref 8.4–10.5)
Creatinine, Ser: 0.78 mg/dL (ref 0.50–1.35)
GFR calc non Af Amer: >90 mL/min (ref >90)
Sodium: 133 mEq/L — ABNORMAL LOW (ref 135–145)
Total Protein: 6.1 g/dL (ref 6.0–8.3)

## 2012-08-28 LAB — URINALYSIS, ROUTINE W REFLEX MICROSCOPIC
Hgb urine dipstick: NEGATIVE
Nitrite: NEGATIVE
Specific Gravity, Urine: 1.029 (ref 1.005–1.030)
Urobilinogen, UA: 0.2 mg/dL (ref 0.0–1.0)
pH: 5.5 (ref 5.0–8.0)

## 2012-08-28 LAB — CBC WITH DIFFERENTIAL/PLATELET
Basophils Absolute: 0.1 10*3/uL (ref 0.0–0.1)
Basophils Relative: 1 % (ref 0–1)
Eosinophils Absolute: 0.1 10*3/uL (ref 0.0–0.7)
Eosinophils Relative: 1 % (ref 0–5)
HCT: 34.3 % — ABNORMAL LOW (ref 39.0–52.0)
MCH: 29.5 pg (ref 26.0–34.0)
MCHC: 33.5 g/dL (ref 30.0–36.0)
MCV: 87.9 fL (ref 78.0–100.0)
Monocytes Absolute: 1 10*3/uL (ref 0.1–1.0)
Platelets: 751 10*3/uL — ABNORMAL HIGH (ref 150–400)
RDW: 13.8 % (ref 11.5–15.5)

## 2012-08-28 LAB — GLUCOSE, CAPILLARY: Glucose-Capillary: 136 mg/dL — ABNORMAL HIGH (ref 70–99)

## 2012-08-28 MED ORDER — ONDANSETRON HCL 4 MG/2ML IJ SOLN
INTRAMUSCULAR | Status: AC
  Administered 2012-08-28: 4 mg
  Filled 2012-08-28: qty 2

## 2012-08-28 MED ORDER — INSULIN ASPART 100 UNIT/ML ~~LOC~~ SOLN
0.0000 [IU] | Freq: Three times a day (TID) | SUBCUTANEOUS | Status: DC
  Administered 2012-08-29 (×2): 2 [IU] via SUBCUTANEOUS
  Administered 2012-08-30 – 2012-08-31 (×2): 3 [IU] via SUBCUTANEOUS
  Administered 2012-08-31 (×2): 2 [IU] via SUBCUTANEOUS
  Administered 2012-09-01: 3 [IU] via SUBCUTANEOUS
  Administered 2012-09-02 – 2012-09-03 (×4): 2 [IU] via SUBCUTANEOUS
  Administered 2012-09-03: 3 [IU] via SUBCUTANEOUS
  Administered 2012-09-03 – 2012-09-04 (×2): 2 [IU] via SUBCUTANEOUS
  Administered 2012-09-04: 3 [IU] via SUBCUTANEOUS
  Administered 2012-09-05: 2 [IU] via SUBCUTANEOUS
  Administered 2012-09-05: 5 [IU] via SUBCUTANEOUS
  Administered 2012-09-06 (×2): 3 [IU] via SUBCUTANEOUS
  Administered 2012-09-06: 2 [IU] via SUBCUTANEOUS

## 2012-08-28 MED ORDER — ACETAMINOPHEN 650 MG RE SUPP: 650.0000 mg | Freq: Four times a day (QID) | RECTAL | Status: DC | PRN

## 2012-08-28 MED ORDER — PROMETHAZINE HCL 25 MG/ML IJ SOLN
12.5000 mg | Freq: Four times a day (QID) | INTRAMUSCULAR | Status: DC | PRN
  Administered 2012-08-31: 12.5 mg via INTRAVENOUS
  Filled 2012-08-28: qty 1

## 2012-08-28 MED ORDER — SODIUM CHLORIDE 0.9 % IV SOLN
1000.0000 mL | Freq: Once | INTRAVENOUS | Status: AC
  Administered 2012-08-28: 1000 mL via INTRAVENOUS

## 2012-08-28 MED ORDER — METOCLOPRAMIDE HCL 5 MG/ML IJ SOLN: 5.0000 mg | Freq: Three times a day (TID) | INTRAMUSCULAR | Status: DC | PRN

## 2012-08-28 MED ORDER — ONDANSETRON HCL 4 MG/2ML IJ SOLN
4.0000 mg | Freq: Four times a day (QID) | INTRAMUSCULAR | Status: DC | PRN
  Administered 2012-08-29 – 2012-09-02 (×4): 4 mg via INTRAVENOUS
  Filled 2012-08-28 (×6): qty 2

## 2012-08-28 MED ORDER — ACETAMINOPHEN 325 MG PO TABS: 650.0000 mg | ORAL_TABLET | Freq: Four times a day (QID) | ORAL | Status: DC | PRN

## 2012-08-28 MED ORDER — SODIUM CHLORIDE 0.9 % IV SOLN
INTRAVENOUS | Status: DC
  Administered 2012-08-28 – 2012-09-03 (×12): via INTRAVENOUS

## 2012-08-28 MED ORDER — HEPARIN SODIUM (PORCINE) 5000 UNIT/ML IJ SOLN
5000.0000 [IU] | Freq: Three times a day (TID) | INTRAMUSCULAR | Status: DC
  Administered 2012-08-28 – 2012-08-29 (×2): 5000 [IU] via SUBCUTANEOUS
  Filled 2012-08-28 (×5): qty 1

## 2012-08-28 MED ORDER — PANTOPRAZOLE SODIUM 40 MG IV SOLR
40.0000 mg | Freq: Every day | INTRAVENOUS | Status: DC
  Administered 2012-08-28 – 2012-09-02 (×6): 40 mg via INTRAVENOUS
  Filled 2012-08-28 (×10): qty 40

## 2012-08-28 MED ORDER — HYDROMORPHONE HCL PF 1 MG/ML IJ SOLN: 1.0000 mg | INTRAMUSCULAR | Status: DC | PRN

## 2012-08-28 MED ORDER — SODIUM CHLORIDE 0.9 % IV SOLN
1000.0000 mL | INTRAVENOUS | Status: DC
  Administered 2012-08-28: 1000 mL via INTRAVENOUS

## 2012-08-28 NOTE — Telephone Encounter (Signed)
Wife called stating pt was having frequent vomiting, could not keep much down at all.  Stoma out put OK.  She was concerned he was getting dehydrated.  I advised her to take him to the emergency room for evaluation with lab and x-rays.
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
## 2012-08-28 NOTE — ED Provider Notes (Signed)
Dr. Dwain Sarna to bedside, will admit   Arman Filter, NP 08/28/12 2043

## 2012-08-28 NOTE — ED Notes (Signed)
Called 6N to give report - Status time is 22 minutes.  RN stated she was "right in the middle of something."
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
## 2012-08-29 ENCOUNTER — Other Ambulatory Visit (HOSPITAL_COMMUNITY): Payer: Self-pay

## 2012-08-29 ENCOUNTER — Inpatient Hospital Stay (HOSPITAL_COMMUNITY): Payer: BC Managed Care – PPO

## 2012-08-29 LAB — COMPREHENSIVE METABOLIC PANEL
AST: 36 U/L (ref 0–37)
BUN: 13 mg/dL (ref 6–23)
CO2: 23 mEq/L (ref 19–32)
Chloride: 103 mEq/L (ref 96–112)
Creatinine, Ser: 0.77 mg/dL (ref 0.50–1.35)
GFR calc Af Amer: >90 mL/min (ref >90)
GFR calc non Af Amer: >90 mL/min (ref >90)
Glucose, Bld: 137 mg/dL — ABNORMAL HIGH (ref 70–99)
Total Bilirubin: 0.4 mg/dL (ref 0.3–1.2)

## 2012-08-29 LAB — CBC
MCH: 29.3 pg (ref 26.0–34.0)
Platelets: 701 10*3/uL — ABNORMAL HIGH (ref 150–400)
RBC: 3.31 MIL/uL — ABNORMAL LOW (ref 4.22–5.81)
WBC: 6.9 10*3/uL (ref 4.0–10.5)

## 2012-08-29 LAB — GLUCOSE, CAPILLARY: Glucose-Capillary: 123 mg/dL — ABNORMAL HIGH (ref 70–99)

## 2012-08-29 MED ORDER — PNEUMOCOCCAL VAC POLYVALENT 25 MCG/0.5ML IJ INJ
0.5000 mL | INJECTION | INTRAMUSCULAR | Status: AC
  Administered 2012-08-30: 0.5 mL via INTRAMUSCULAR
  Filled 2012-08-29: qty 0.5

## 2012-08-29 MED ORDER — HEPARIN BOLUS VIA INFUSION
2500.0000 [IU] | INTRAVENOUS | Status: AC
  Administered 2012-08-29: 2500 [IU] via INTRAVENOUS
  Filled 2012-08-29: qty 2500

## 2012-08-29 MED ORDER — HEPARIN (PORCINE) IN NACL 100-0.45 UNIT/ML-% IJ SOLN
1050.0000 [IU]/h | INTRAMUSCULAR | Status: DC
  Administered 2012-08-29: 1050 [IU]/h via INTRAVENOUS
  Filled 2012-08-29: qty 250

## 2012-08-29 MED ORDER — BIOTENE DRY MOUTH MT LIQD
15.0000 mL | Freq: Two times a day (BID) | OROMUCOSAL | Status: DC
  Administered 2012-08-29 – 2012-09-01 (×5): 15 mL via OROMUCOSAL

## 2012-08-29 MED ORDER — CHLORHEXIDINE GLUCONATE 0.12 % MT SOLN
15.0000 mL | Freq: Two times a day (BID) | OROMUCOSAL | Status: DC
  Administered 2012-08-29 – 2012-09-02 (×8): 15 mL via OROMUCOSAL
  Filled 2012-08-29 (×7): qty 15

## 2012-08-29 MED ORDER — IOHEXOL 350 MG/ML SOLN
75.0000 mL | Freq: Once | INTRAVENOUS | Status: AC | PRN
  Administered 2012-08-29: 50 mL via INTRAVENOUS

## 2012-08-29 MED ORDER — IOHEXOL 300 MG/ML  SOLN
100.0000 mL | Freq: Once | INTRAMUSCULAR | Status: AC | PRN
  Administered 2012-08-29: 100 mL via INTRAVENOUS

## 2012-08-29 MED ORDER — IOHEXOL 300 MG/ML  SOLN: 25.0000 mL | INTRAMUSCULAR | Status: AC

## 2012-08-29 NOTE — ED Provider Notes (Signed)
Pt originally seen by me, repeat call to Dr Dwain Sarna made by NP  Ward Givens, MD 08/29/12 [phone]

## 2012-08-29 NOTE — Progress Notes (Signed)
Pt states he is having some tingling,numbness in right leg. Pt's leg is warm,+PP;+movement. He says this has been going on since he left the hospital last time.  Pt able to walk to bathroom with walker. Pt denies pain. Will continue to monitor.

## 2012-08-29 NOTE — Progress Notes (Signed)
<principal problem not specified>  Assessment: Suspect GOO as etiology for nausea, may also have abd abscess Volume depletion improved   Plan: On ivf. Await CT to make additional decisions   Subjective: Still with nausea, has been able to get  1/2 cups contrast for planned ct. Not much pain. Has been miserable at home with inability to keep liquids down, no appetite, even smell of food made him sick  Objective: Vital signs in last 24 hours: Temp:  [98.4 F (36.9 C)-99 F (37.2 C)] 98.8 F (37.1 C) (07/13 0530) Pulse Rate:  [80-90] 80 (07/13 0530) Resp:  [18] 18 (07/13 0530) BP: (102-120)/(61-67) 102/61 mmHg (07/13 0530) SpO2:  [97 %-100 %] 100 % (07/13 0530) Weight:  [140 lb (63.504 kg)] 140 lb (63.504 kg) (07/12 2239) Last BM Date: 08/28/12  Intake/Output from previous day: 07/12 0701 - 07/13 0700 In: 947.9 [I.V.:947.9] Out: 250 [Stool:250]  General appearance: alert, cooperative, fatigued, no distress and appears somewhat malnourished Resp: clear to auscultation bilaterally GI: Abd not distended, soft, some mild tenderness between midline and colostomy, no guradiing no rebound, BS+. Woound clean, air and stool in colostomy bag  Lab Results:  Results for orders placed during the hospital encounter of 08/28/12 (from the past 24 hour(s))  CBC WITH DIFFERENTIAL     Status: Abnormal   Collection Time    08/28/12  5:08 PM      Result Value Range   WBC 10.1  4.0 - 10.5 K/uL   RBC 3.90 (*) 4.22 - 5.81 MIL/uL   Hemoglobin 11.5 (*) 13.0 - 17.0 g/dL   HCT 56.2 (*) 13.0 - 86.5 %   MCV 87.9  78.0 - 100.0 fL   MCH 29.5  26.0 - 34.0 pg   MCHC 33.5  30.0 - 36.0 g/dL   RDW 78.4  69.6 - 29.5 %   Platelets 751 (*) 150 - 400 K/uL   Neutrophils Relative % 77  43 - 77 %   Neutro Abs 7.8 (*) 1.7 - 7.7 K/uL   Lymphocytes Relative 12  12 - 46 %   Lymphs Abs 1.2  0.7 - 4.0 K/uL   Monocytes Relative 10  3 - 12 %   Monocytes Absolute 1.0  0.1 - 1.0 K/uL   Eosinophils Relative 1  0 - 5  %   Eosinophils Absolute 0.1  0.0 - 0.7 K/uL   Basophils Relative 1  0 - 1 %   Basophils Absolute 0.1  0.0 - 0.1 K/uL  COMPREHENSIVE METABOLIC PANEL     Status: Abnormal   Collection Time    08/28/12  5:08 PM      Result Value Range   Sodium 133 (*) 135 - 145 mEq/L   Potassium 5.0  3.5 - 5.1 mEq/L   Chloride 100  96 - 112 mEq/L   CO2 28  19 - 32 mEq/L   Glucose, Bld 165 (*) 70 - 99 mg/dL   BUN 17  6 - 23 mg/dL   Creatinine, Ser 2.84  0.50 - 1.35 mg/dL   Calcium 8.9  8.4 - 13.2 mg/dL   Total Protein 6.1  6.0 - 8.3 g/dL   Albumin 2.8 (*) 3.5 - 5.2 g/dL   AST 44 (*) 0 - 37 U/L   ALT 90 (*) 0 - 53 U/L   Alkaline Phosphatase 245 (*) 39 - 117 U/L   Total Bilirubin 0.5  0.3 - 1.2 mg/dL   GFR calc non Af Amer >90  >90 mL/min  GFR calc Af Amer >90  >90 mL/min  URINALYSIS, ROUTINE W REFLEX MICROSCOPIC     Status: Abnormal   Collection Time    08/28/12  6:14 PM      Result Value Range   Color, Urine YELLOW  YELLOW   APPearance CLOUDY (*) CLEAR   Specific Gravity, Urine 1.029  1.005 - 1.030   pH 5.5  5.0 - 8.0   Glucose, UA NEGATIVE  NEGATIVE mg/dL   Hgb urine dipstick NEGATIVE  NEGATIVE   Bilirubin Urine SMALL (*) NEGATIVE   Ketones, ur NEGATIVE  NEGATIVE mg/dL   Protein, ur NEGATIVE  NEGATIVE mg/dL   Urobilinogen, UA 0.2  0.0 - 1.0 mg/dL   Nitrite NEGATIVE  NEGATIVE   Leukocytes, UA NEGATIVE  NEGATIVE  GLUCOSE, CAPILLARY     Status: Abnormal   Collection Time    08/28/12 10:48 PM      Result Value Range   Glucose-Capillary 136 (*) 70 - 99 mg/dL   Comment 1 Documented in Chart     Comment 2 Notify RN    COMPREHENSIVE METABOLIC PANEL     Status: Abnormal   Collection Time    08/29/12  6:35 AM      Result Value Range   Sodium 135  135 - 145 mEq/L   Potassium 4.2  3.5 - 5.1 mEq/L   Chloride 103  96 - 112 mEq/L   CO2 23  19 - 32 mEq/L   Glucose, Bld 137 (*) 70 - 99 mg/dL   BUN 13  6 - 23 mg/dL   Creatinine, Ser 4.54  0.50 - 1.35 mg/dL   Calcium 8.4  8.4 - 09.8 mg/dL    Total Protein 5.0 (*) 6.0 - 8.3 g/dL   Albumin 2.4 (*) 3.5 - 5.2 g/dL   AST 36  0 - 37 U/L   ALT 67 (*) 0 - 53 U/L   Alkaline Phosphatase 187 (*) 39 - 117 U/L   Total Bilirubin 0.4  0.3 - 1.2 mg/dL   GFR calc non Af Amer >90  >90 mL/min   GFR calc Af Amer >90  >90 mL/min  CBC     Status: Abnormal   Collection Time    08/29/12  6:35 AM      Result Value Range   WBC 6.9  4.0 - 10.5 K/uL   RBC 3.31 (*) 4.22 - 5.81 MIL/uL   Hemoglobin 9.7 (*) 13.0 - 17.0 g/dL   HCT 11.9 (*) 14.7 - 82.9 %   MCV 87.6  78.0 - 100.0 fL   MCH 29.3  26.0 - 34.0 pg   MCHC 33.4  30.0 - 36.0 g/dL   RDW 56.2  13.0 - 86.5 %   Platelets 701 (*) 150 - 400 K/uL  GLUCOSE, CAPILLARY     Status: Abnormal   Collection Time    08/29/12  8:27 AM      Result Value Range   Glucose-Capillary 123 (*) 70 - 99 mg/dL     Studies/Results Radiology     MEDS, Scheduled . antiseptic oral rinse  15 mL Mouth Rinse q12n4p  . chlorhexidine  15 mL Mouth Rinse BID  . heparin  5,000 Units Subcutaneous Q8H  . insulin aspart  0-15 Units Subcutaneous TID WC  . pantoprazole (PROTONIX) IV  40 mg Intravenous QHS  . [START ON 08/30/2012] pneumococcal 23 valent vaccine  0.5 mL Intramuscular Tomorrow-1000       LOS: 1 day    Karlisha Mathena J. Kaci Freel,  MD, [facility], [place] [phone]   08/29/2012 9:41 AM
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
## 2012-08-30 DIAGNOSIS — I2699 Other pulmonary embolism without acute cor pulmonale: Secondary | ICD-10-CM | POA: Diagnosis present

## 2012-08-30 DIAGNOSIS — D62 Acute posthemorrhagic anemia: Secondary | ICD-10-CM

## 2012-08-30 DIAGNOSIS — R112 Nausea with vomiting, unspecified: Secondary | ICD-10-CM | POA: Diagnosis present

## 2012-08-30 LAB — CBC
HCT: 28.9 % — ABNORMAL LOW (ref 39.0–52.0)
MCH: 29.6 pg (ref 26.0–34.0)
MCV: 86.8 fL (ref 78.0–100.0)
MCV: 86.3 fL (ref 78.0–100.0)
Platelets: 706 10*3/uL — ABNORMAL HIGH (ref 150–400)
RBC: 3.35 MIL/uL — ABNORMAL LOW (ref 4.22–5.81)
RDW: 13.4 % (ref 11.5–15.5)
RDW: 13.6 % (ref 11.5–15.5)
WBC: 7.5 10*3/uL (ref 4.0–10.5)
WBC: 6.6 10*3/uL (ref 4.0–10.5)

## 2012-08-30 LAB — GLUCOSE, CAPILLARY

## 2012-08-30 LAB — HEPARIN LEVEL (UNFRACTIONATED): Heparin Unfractionated: 1.19 [IU]/mL — ABNORMAL HIGH (ref 0.30–0.70)

## 2012-08-30 LAB — VITAMIN B12: Vitamin B-12: 390 pg/mL (ref 211–911)

## 2012-08-30 MED ORDER — COUMADIN BOOK
Freq: Once | Status: AC
  Administered 2012-08-30: 17:00:00
  Filled 2012-08-30: qty 1

## 2012-08-30 MED ORDER — WARFARIN SODIUM 7.5 MG PO TABS
7.5000 mg | ORAL_TABLET | Freq: Once | ORAL | Status: AC
  Administered 2012-08-30: 7.5 mg via ORAL
  Filled 2012-08-30: qty 1

## 2012-08-30 MED ORDER — BOOST / RESOURCE BREEZE PO LIQD
1.0000 | Freq: Three times a day (TID) | ORAL | Status: DC
  Administered 2012-08-30 – 2012-09-06 (×16): 1 via ORAL

## 2012-08-30 MED ORDER — WARFARIN - PHARMACIST DOSING INPATIENT
Freq: Every day | Status: DC
  Administered 2012-08-30 – 2012-09-03 (×2)
  Administered 2012-09-04: 1
  Administered 2012-09-05: 18:00:00

## 2012-08-30 MED ORDER — HEPARIN (PORCINE) IN NACL 100-0.45 UNIT/ML-% IJ SOLN
1100.0000 [IU]/h | INTRAMUSCULAR | Status: DC
  Administered 2012-08-31: 750 [IU]/h via INTRAVENOUS
  Administered 2012-08-31: 800 [IU]/h via INTRAVENOUS
  Administered 2012-09-01: 1000 [IU]/h via INTRAVENOUS
  Administered 2012-09-03: 1100 [IU]/h via INTRAVENOUS
  Filled 2012-08-30 (×7): qty 250

## 2012-08-30 MED ORDER — WARFARIN VIDEO
Freq: Once | Status: AC
  Administered 2012-08-30: 17:00:00

## 2012-08-30 MED ORDER — HEPARIN (PORCINE) IN NACL 100-0.45 UNIT/ML-% IJ SOLN
1050.0000 [IU]/h | INTRAMUSCULAR | Status: DC
  Administered 2012-08-30 (×2): 1050 [IU]/h via INTRAVENOUS
  Filled 2012-08-30 (×2): qty 250

## 2012-08-30 MED ORDER — HEPARIN (PORCINE) IN NACL 100-0.45 UNIT/ML-% IJ SOLN
850.0000 [IU]/h | INTRAMUSCULAR | Status: DC
  Filled 2012-08-30: qty 250

## 2012-08-30 NOTE — Progress Notes (Addendum)
ANTICOAGULATION CONSULT NOTE  Pharmacy Consult for Heparin >> adding Warfarin  Indication: pulmonary embolus  No Known Allergies  Patient Measurements: Height: 5\' 6"  (167.6 cm) Weight: 140 lb (63.504 kg) IBW/kg (Calculated) : 63.8  Vital Signs: Temp: 98.7 F (37.1 C) (07/14 1350) Temp src: Oral (07/14 1350) BP: 97/63 mmHg (07/14 1350) Pulse Rate: 85 (07/14 1350)  Labs:  Recent Labs  08/28/12 1708 08/29/12 0635 08/29/12 2330 08/30/12 0112 08/30/12 0420 08/30/12 0430  HGB 11.5* 9.7*  --  9.9*  --  9.9*  HCT 34.3* 29.0*  --  29.0*  --  28.9*  PLT 751* 701*  --  706*  --  706*  HEPARINUNFRC  --   --  >2.00*  --  1.63*  --   CREATININE 0.78 0.77  --   --   --   --    Estimated Creatinine Clearance: 102.5 ml/min (by C-G formula based on Cr of 0.77).  Assessment: 47 y.o male with PE for heparin.  Heparin at 1050 units/hr.  Level scheduled for 14:00 - not yet drawn.  We have now been asked to start Warfarin therapy as well.  He will need 5 day overlap at minimum.  Could be switched to Lovenox if you plan to discharge to cover 5 days.  He has some anemia with H/H 9.9/28.9 but thrombocytosis.  He has no noted bleeding complications.  Education:  Will provide Warfarin teaching book/video for review today and plan to f/u with him prior to discharge  Goal of Therapy:  INR 2.0-3.0 Heparin level 0.3-0.7 units/ml Monitor platelets by anticoagulation protocol: Yes   Plan:  1.  Warfarin 7.5mg  x 1 2.  Warfarin teaching book and video 3.  Await heparin level, will adjust as needed 4.  Daily protimes with HL and CBC  Nadara Mustard, PharmD., MS Clinical Pharmacist Pager:  [phone] Thank you for allowing pharmacy to be part of this patients care team. 08/30/2012,3:44 PM  1652: Addendum:   Heparin level is now 1.19 on 1050 units/hr.    Assessment:   Heparin level is above goal and decreasing.  Plan: decrease heparin rate to 1050 units/hr and check Heparin level in 6  hours.  Celedonio Miyamoto, PharmD, BCPS Clinical Pharmacist Pager [phone]
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
## 2012-08-30 NOTE — Progress Notes (Signed)
*  Preliminary Results* Bilateral lower extremity venous duplex completed. Bilateral lower extremities are negative for deep vein thrombosis. There is no evidence of Baker's cyst bilaterally.  08/30/2012  Gertie Fey, RVT, RDCS, RDMS

## 2012-08-30 NOTE — Progress Notes (Signed)
ANTICOAGULATION CONSULT NOTE  Pharmacy Consult for Heparin Indication: pulmonary embolus  No Known Allergies  Patient Measurements: Height: 5\' 6"  (167.6 cm) Weight: 140 lb (63.504 kg) IBW/kg (Calculated) : 63.8   Vital Signs: Temp: 99.1 F (37.3 C) (07/13 2119) Temp src: Oral (07/13 2119) BP: 100/68 mmHg (07/13 2119) Pulse Rate: 73 (07/13 2119)  Labs:  Recent Labs  08/28/12 1708 08/29/12 0635 08/29/12 2330  HGB 11.5* 9.7*  --   HCT 34.3* 29.0*  --   PLT 751* 701*  --   HEPARINUNFRC  --   --  >2.00*  CREATININE 0.78 0.77  --     Estimated Creatinine Clearance: 102.5 ml/min (by C-G formula based on Cr of 0.77).  Assessment: 47 y.o male with PE for heparin.  Spoke with RN.  Heparin has been running at 2500 units/hr instead of 1050 units/hr as ordered since 5 pm.    Goal of Therapy:  Heparin level 0.3-0.7 units/ml Monitor platelets by anticoagulation protocol: Yes   Plan:  Hold heparin for now. Have ordered STAT CBC and will notify MD tonight of any significant changes Recheck Heparin level in 3 hours and restart when appropriate.  Geannie Risen, PharmD, BCPS  08/30/2012,12:27 AM

## 2012-08-30 NOTE — Progress Notes (Signed)
UR completed 

## 2012-08-30 NOTE — Progress Notes (Signed)
ANTICOAGULATION CONSULT NOTE  Pharmacy Consult for Heparin Indication: pulmonary embolus  No Known Allergies  Patient Measurements: Height: 5\' 6"  (167.6 cm) Weight: 140 lb (63.504 kg) IBW/kg (Calculated) : 63.8   Vital Signs: Temp: 98.4 F (36.9 C) (07/14 2200) Temp src: Oral (07/14 2200) BP: 99/62 mmHg (07/14 2200) Pulse Rate: 75 (07/14 2200)  Labs:  Recent Labs  08/28/12 1708 08/29/12 0635  08/30/12 0112 08/30/12 0420 08/30/12 0430 08/30/12 1547 08/30/12 2305  HGB 11.5* 9.7*  --  9.9*  --  9.9*  --   --   HCT 34.3* 29.0*  --  29.0*  --  28.9*  --   --   PLT 751* 701*  --  706*  --  706*  --   --   HEPARINUNFRC  --   --   < >  --  1.63*  --  1.19* 1.02*  CREATININE 0.78 0.77  --   --   --   --   --   --   < > = values in this interval not displayed.  Estimated Creatinine Clearance: 102.5 ml/min (by C-G formula based on Cr of 0.77).  Assessment: 47 y.o male with PE for heparin.   Goal of Therapy:  Heparin level 0.3-0.7 units/ml Monitor platelets by anticoagulation protocol: Yes   Plan:  Hold heparin x 1 hr, then decrease heparin 750 units/hr Check heparin level in 8 hours.  Geannie Risen, PharmD, BCPS  08/30/2012,11:56 PM

## 2012-08-30 NOTE — Progress Notes (Signed)
Patient with a lot of questions.  Okay to start clear liquids.  Watch carefully.    This patient has been seen and I agree with the findings and treatment plan.  Marta Lamas. Gae Bon, MD, FACS [phone] (pager) [phone] (direct pager) Trauma Surgeon

## 2012-08-30 NOTE — Progress Notes (Signed)
ANTICOAGULATION CONSULT NOTE  Pharmacy Consult for Heparin Indication: pulmonary embolus  No Known Allergies  Patient Measurements: Height: 5\' 6"  (167.6 cm) Weight: 140 lb (63.504 kg) IBW/kg (Calculated) : 63.8   Vital Signs: Temp: 98.6 F (37 C) (07/14 0456) Temp src: Oral (07/14 0456) BP: 108/48 mmHg (07/14 0456) Pulse Rate: 75 (07/14 0456)  Labs:  Recent Labs  08/28/12 1708 08/29/12 0635 08/29/12 2330 08/30/12 0112 08/30/12 0420 08/30/12 0430  HGB 11.5* 9.7*  --  9.9*  --  9.9*  HCT 34.3* 29.0*  --  29.0*  --  28.9*  PLT 751* 701*  --  706*  --  706*  HEPARINUNFRC  --   --  >2.00*  --  1.63*  --   CREATININE 0.78 0.77  --   --   --   --     Estimated Creatinine Clearance: 102.5 ml/min (by C-G formula based on Cr of 0.77).  Assessment: 47 y.o male with PE for heparin.  Heparin currently being held.  Repeat level now measurable, though still very elevated.  Goal of Therapy:  Heparin level 0.3-0.7 units/ml Monitor platelets by anticoagulation protocol: Yes   Plan:  Will restart heparin 1050 units/hr at 0600. Check heparin level in 8 hours.   Geannie Risen, PharmD, BCPS  08/30/2012,5:10 AM

## 2012-08-31 DIAGNOSIS — E440 Moderate protein-calorie malnutrition: Secondary | ICD-10-CM | POA: Diagnosis present

## 2012-08-31 LAB — PROTIME-INR
INR: 1.47 (ref 0.00–1.49)
Prothrombin Time: 17.4 s — ABNORMAL HIGH (ref 11.6–15.2)

## 2012-08-31 LAB — HEPARIN LEVEL (UNFRACTIONATED): Heparin Unfractionated: 0.38 [IU]/mL (ref 0.30–0.70)

## 2012-08-31 LAB — GLUCOSE, CAPILLARY: Glucose-Capillary: 145 mg/dL — ABNORMAL HIGH (ref 70–99)

## 2012-08-31 LAB — CBC
MCHC: 33.6 g/dL (ref 30.0–36.0)
Platelets: 564 10*3/uL — ABNORMAL HIGH (ref 150–400)
RDW: 14 % (ref 11.5–15.5)
WBC: 5.2 10*3/uL (ref 4.0–10.5)

## 2012-08-31 LAB — BASIC METABOLIC PANEL
Calcium: 8.3 mg/dL — ABNORMAL LOW (ref 8.4–10.5)
Creatinine, Ser: 0.66 mg/dL (ref 0.50–1.35)
GFR calc Af Amer: >90 mL/min (ref >90)

## 2012-08-31 MED ORDER — PATIENT'S GUIDE TO USING COUMADIN BOOK
Freq: Once | Status: DC
  Filled 2012-08-31 (×2): qty 1

## 2012-08-31 MED ORDER — METOCLOPRAMIDE HCL 5 MG/ML IJ SOLN
5.0000 mg | Freq: Three times a day (TID) | INTRAMUSCULAR | Status: DC
  Administered 2012-08-31 – 2012-09-03 (×8): 5 mg via INTRAVENOUS
  Administered 2012-09-03: 06:00:00 via INTRAVENOUS
  Administered 2012-09-04 – 2012-09-05 (×4): 5 mg via INTRAVENOUS
  Filled 2012-08-31: qty 1
  Filled 2012-08-31: qty 2
  Filled 2012-08-31 (×7): qty 1
  Filled 2012-08-31: qty 2
  Filled 2012-08-31 (×3): qty 1
  Filled 2012-08-31: qty 2
  Filled 2012-08-31 (×4): qty 1
  Filled 2012-08-31 (×3): qty 2

## 2012-08-31 MED ORDER — WARFARIN SODIUM 7.5 MG PO TABS
7.5000 mg | ORAL_TABLET | Freq: Once | ORAL | Status: AC
  Administered 2012-08-31: 7.5 mg via ORAL
  Filled 2012-08-31 (×3): qty 1
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
## 2012-08-31 NOTE — Progress Notes (Signed)
ANTICOAGULATION CONSULT NOTE - Follow Up Consult  Pharmacy Consult for Heparin Indication: pulmonary embolus  No Known Allergies  Patient Measurements: Height: 5\' 6"  (167.6 cm) Weight: 140 lb (63.504 kg) IBW/kg (Calculated) : 63.8 Heparin Dosing Weight: 63.5 kg  Vital Signs: Temp: 98 F (36.7 C) (07/15 1358) Temp src: Oral (07/15 1358) BP: 103/59 mmHg (07/15 1358) Pulse Rate: 85 (07/15 1358)  Labs:  Recent Labs  08/29/12 0635  08/30/12 0112  08/30/12 0430  08/30/12 2305 08/31/12 0905 08/31/12 1644  HGB 9.7*  --  9.9*  --  9.9*  --   --  11.2*  --   HCT 29.0*  --  29.0*  --  28.9*  --   --  33.3*  --   PLT 701*  --  706*  --  706*  --   --  564*  --   LABPROT  --   --   --   --   --   --   --  17.4*  --   INR  --   --   --   --   --   --   --  1.47  --   HEPARINUNFRC  --   < >  --   < >  --   < > 1.02* 0.53 0.38  CREATININE 0.77  --   --   --   --   --   --  0.66  --   < > = values in this interval not displayed.  Estimated Creatinine Clearance: 102.5 ml/min (by C-G formula based on Cr of 0.66).  Assessment:   Heparin level tonight therapeutic (0.38) on 750 units/hr.   Level earlier today was 0.53, so down some. RN reports some brief (< 5 minutes) interruptions of heparin infusion for flushing and med administration (Phenergan and Protonix not compatible, Reglan and Zofran are compatible.)  Patient did not want 2nd IV.  Second Coumadin dose today.  Goal of Therapy:  Heparin level 0.3-0.7 units/ml Monitor platelets by anticoagulation protocol: Yes   Plan:   Will increase heparin drip from 750 to 800 units/hr, to try to keep heparin level in target range.  Next heparin level, PT/INR and CBC in am.  Dennie Fetters, [place] Pager: [phone] 08/31/2012,7:20 PM

## 2012-08-31 NOTE — Progress Notes (Signed)
Patient continue to be nauseous, NP  Made aware.new orders given and noted.

## 2012-08-31 NOTE — Consult Note (Signed)
WOC ostomy consult  Stoma type/location: RLQ, end colostomy. Pt known to this WOC from his previous admission and original ostomy creation.  He has HHRN and his wife has been assisting at home. He has not been doing his ostomy care. He can empty per his report.  Stomal assessment/size: 2" oval, pink, the slough on the stoma previously has almost completely came off.  Peristomal assessment: intact, some maceration from 4-6 oclock, may be related to the fact the pouch has not been changed in 6 days.  Treatment options for stomal/peristomal skin: using 2" barrier ring to aid in seal of pouch Output liquid, green  Ostomy pouching: 1pc. With 2" barrier ring cut to fit stoma today.  WOC did complete pouch change.   Education provided: pouch used today had a little different lock and roll system, I instructed the patient on this. Supplies ordered to the bedside for use. New samples with filter sent to pt home per his request to see if this will be better for is gas.   WOC will follow along as needed for ostomy care Znya Albino [facility] [place] RN,CWOCN [phone]
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
## 2012-08-31 NOTE — Progress Notes (Signed)
Doing better.  No real nausea.  Wants to see if he can buy [employer] for home as he likes it a lot.  Await anticoagulation with coumadin.  Advance diet slowly. Patient examined and I agree with the assessment and plan  Violeta Gelinas, MD, MPH, FACS Pager: [phone]  08/31/2012 9:37 AM

## 2012-09-01 LAB — PROTIME-INR
INR: 2.1 — ABNORMAL HIGH (ref 0.00–1.49)
Prothrombin Time: 22.9 s — ABNORMAL HIGH (ref 11.6–15.2)

## 2012-09-01 LAB — CBC
HCT: 29.5 % — ABNORMAL LOW (ref 39.0–52.0)
MCV: 87.5 fL (ref 78.0–100.0)
Platelets: 628 10*3/uL — ABNORMAL HIGH (ref 150–400)
RBC: 3.37 MIL/uL — ABNORMAL LOW (ref 4.22–5.81)
WBC: 6.2 10*3/uL (ref 4.0–10.5)

## 2012-09-01 LAB — GLUCOSE, CAPILLARY
Glucose-Capillary: 165 mg/dL — ABNORMAL HIGH (ref 70–99)
Glucose-Capillary: 121 mg/dL — ABNORMAL HIGH (ref 70–99)

## 2012-09-01 LAB — HEPARIN LEVEL (UNFRACTIONATED): Heparin Unfractionated: 0.57 [IU]/mL (ref 0.30–0.70)

## 2012-09-01 MED ORDER — WARFARIN SODIUM 2.5 MG PO TABS
2.5000 mg | ORAL_TABLET | Freq: Once | ORAL | Status: AC
  Administered 2012-09-01: 2.5 mg via ORAL
  Filled 2012-09-01: qty 1

## 2012-09-01 MED ORDER — HYDROCODONE-ACETAMINOPHEN 5-325 MG PO TABS
1.0000 | ORAL_TABLET | ORAL | Status: DC | PRN
Start: 2012-09-01 — End: 2012-09-07

## 2012-09-01 MED ORDER — HEPARIN BOLUS VIA INFUSION
2000.0000 [IU] | Freq: Once | INTRAVENOUS | Status: AC
  Administered 2012-09-01: 2000 [IU] via INTRAVENOUS
  Filled 2012-09-01: qty 2000
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
## 2012-09-01 NOTE — Progress Notes (Signed)
Post-op nausea - possibly from ileus; started on Reglan INR therapeutic 2.1.  Will continue Heparin gtt until tomorrow.  Continue Coumadin per Pharmacy  Wilmon Arms. Corliss Skains, MD, [facility] Surgery  General/ Trauma Surgery  09/01/2012 10:38 AM

## 2012-09-01 NOTE — Progress Notes (Signed)
ANTICOAGULATION CONSULT NOTE - Follow Up Consult  Pharmacy Consult for Heparin Indication: pulmonary embolus  No Known Allergies  Patient Measurements: Height: 5\' 6"  (167.6 cm) Weight: 140 lb (63.504 kg) IBW/kg (Calculated) : 63.8 Heparin Dosing Weight: 63.5kg  Vital Signs: Temp: 98 F (36.7 C) (07/16 1324) Temp src: Oral (07/16 1324) BP: 107/62 mmHg (07/16 1324) Pulse Rate: 80 (07/16 1324)  Labs:  Recent Labs  08/30/12 0430  08/31/12 0905 08/31/12 1644 09/01/12 0515 09/01/12 1615  HGB 9.9*  --  11.2*  --  9.9*  --   HCT 28.9*  --  33.3*  --  29.5*  --   PLT 706*  --  564*  --  628*  --   LABPROT  --   --  17.4*  --  22.9*  --   INR  --   --  1.47  --  2.10*  --   HEPARINUNFRC  --   < > 0.53 0.38 0.12* 0.57  CREATININE  --   --  0.66  --   --   --   < > = values in this interval not displayed.  Estimated Creatinine Clearance: 102.5 ml/min (by C-G formula based on Cr of 0.66).   Medications:  Heparin 1000 units/hr  Assessment: 46yom on heparin bridging to Coumadin for CTA confirmed PE. Heparin level (0.57) is now therapeutic after rate increase - will check follow-up level tonight to confirm therapeutic. - H/H trending down, Plts elevated - No significant bleeding reported  Goal of Therapy:  Heparin level 0.3-0.7 units/ml Monitor platelets by anticoagulation protocol: Yes   Plan:  1. Continue heparin drip 1000 units/hr (10 ml/hr) 2. Check heparin level tonight @ 2400 to confirm therapeutic 3. Follow-up AM heparin level, INR and CBC  Cleon Dew [phone] 09/01/2012,6:51 PM

## 2012-09-02 LAB — CBC
Hemoglobin: 10.6 g/dL — ABNORMAL LOW (ref 13.0–17.0)
MCH: 29.9 pg (ref 26.0–34.0)
MCHC: 34.2 g/dL (ref 30.0–36.0)
Platelets: 629 10*3/uL — ABNORMAL HIGH (ref 150–400)
RDW: 13.7 % (ref 11.5–15.5)

## 2012-09-02 LAB — HEPARIN LEVEL (UNFRACTIONATED)
Heparin Unfractionated: 0.53 [IU]/mL (ref 0.30–0.70)
Heparin Unfractionated: 0.49 [IU]/mL (ref 0.30–0.70)

## 2012-09-02 LAB — PROTIME-INR: Prothrombin Time: 32.6 s — ABNORMAL HIGH (ref 11.6–15.2)

## 2012-09-02 LAB — GLUCOSE, CAPILLARY
Glucose-Capillary: 132 mg/dL — ABNORMAL HIGH (ref 70–99)
Glucose-Capillary: 113 mg/dL — ABNORMAL HIGH (ref 70–99)

## 2012-09-02 NOTE — Progress Notes (Signed)
Tolerating PO much better.  Wants to try some outside food.  Wound clean.  Good output from ostomy. Patient examined and I agree with the assessment and plan  Violeta Gelinas, MD, MPH, FACS Pager: [phone]  09/02/2012 9:36 AM

## 2012-09-02 NOTE — Progress Notes (Signed)
ANTICOAGULATION CONSULT NOTE  Pharmacy Consult for Heparin Indication: pulmonary embolus  No Known Allergies  Patient Measurements: Height: 5\' 6"  (167.6 cm) Weight: 140 lb (63.504 kg) IBW/kg (Calculated) : 63.8   Vital Signs: Temp: 98.3 F (36.8 C) (07/16 2134) Temp src: Oral (07/16 2134) BP: 111/71 mmHg (07/16 2134) Pulse Rate: 86 (07/16 2134)  Labs:  Recent Labs  08/30/12 0430  08/31/12 0905  09/01/12 0515 09/01/12 1615 09/02/12 0126  HGB 9.9*  --  11.2*  --  9.9*  --   --   HCT 28.9*  --  33.3*  --  29.5*  --   --   PLT 706*  --  564*  --  628*  --   --   LABPROT  --   --  17.4*  --  22.9*  --   --   INR  --   --  1.47  --  2.10*  --   --   HEPARINUNFRC  --   < > 0.53  < > 0.12* 0.57 0.53  CREATININE  --   --  0.66  --   --   --   --   < > = values in this interval not displayed.  Estimated Creatinine Clearance: 102.5 ml/min (by C-G formula based on Cr of 0.66).  Assessment: 47 y.o male with PE for heparin.   Goal of Therapy:  Heparin level 0.3-0.7 units/ml Monitor platelets by anticoagulation protocol: Yes   Plan:  Continue Heparin at current rate   Geannie Risen, PharmD, BCPS   09/02/2012,1:56 AM

## 2012-09-02 NOTE — Progress Notes (Signed)
Called to pt room to discuss pt feelings of nausea.  Pt wants RN and MD to be aware that he has been experiencing nausea since about 1100 today, despite scheduled Reglan.  Zofran doses between seems to help somewhat, but does not last until the next dose of Reglan is due.
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
## 2012-09-02 NOTE — Progress Notes (Signed)
UR completed 

## 2012-09-03 ENCOUNTER — Inpatient Hospital Stay (HOSPITAL_COMMUNITY): Payer: BC Managed Care – PPO

## 2012-09-03 ENCOUNTER — Encounter (INDEPENDENT_AMBULATORY_CARE_PROVIDER_SITE_OTHER): Payer: BC Managed Care – PPO

## 2012-09-03 LAB — BASIC METABOLIC PANEL
BUN: 5 mg/dL — ABNORMAL LOW (ref 6–23)
Calcium: 8.5 mg/dL (ref 8.4–10.5)
GFR calc non Af Amer: >90 mL/min (ref >90)
Glucose, Bld: 130 mg/dL — ABNORMAL HIGH (ref 70–99)

## 2012-09-03 LAB — HEPATIC FUNCTION PANEL
AST: 20 U/L (ref 0–37)
Albumin: 2.3 g/dL — ABNORMAL LOW (ref 3.5–5.2)
Total Bilirubin: 0.3 mg/dL (ref 0.3–1.2)
Total Protein: 5.2 g/dL — ABNORMAL LOW (ref 6.0–8.3)

## 2012-09-03 LAB — CBC
HCT: 27.8 % — ABNORMAL LOW (ref 39.0–52.0)
Hemoglobin: 9.4 g/dL — ABNORMAL LOW (ref 13.0–17.0)
MCH: 29.7 pg (ref 26.0–34.0)
MCHC: 33.8 g/dL (ref 30.0–36.0)
MCV: 87.7 fL (ref 78.0–100.0)
RBC: 3.17 MIL/uL — ABNORMAL LOW (ref 4.22–5.81)

## 2012-09-03 LAB — HEPARIN LEVEL (UNFRACTIONATED): Heparin Unfractionated: 0.29 [IU]/mL — ABNORMAL LOW (ref 0.30–0.70)

## 2012-09-03 LAB — GLUCOSE, CAPILLARY
Glucose-Capillary: 127 mg/dL — ABNORMAL HIGH (ref 70–99)
Glucose-Capillary: 199 mg/dL — ABNORMAL HIGH (ref 70–99)

## 2012-09-03 LAB — LIPASE, BLOOD: Lipase: 167 U/L — ABNORMAL HIGH (ref 11–59)

## 2012-09-03 MED ORDER — POTASSIUM CHLORIDE 2 MEQ/ML IV SOLN
INTRAVENOUS | Status: DC
  Administered 2012-09-03 – 2012-09-04 (×2): via INTRAVENOUS
  Filled 2012-09-03 (×5): qty 1000

## 2012-09-03 MED ORDER — WARFARIN SODIUM 5 MG PO TABS
5.0000 mg | ORAL_TABLET | Freq: Once | ORAL | Status: AC
  Administered 2012-09-03: 5 mg via ORAL
  Filled 2012-09-03: qty 1

## 2012-09-03 MED ORDER — ERYTHROMYCIN BASE 250 MG PO TABS
250.0000 mg | ORAL_TABLET | Freq: Three times a day (TID) | ORAL | Status: DC
  Administered 2012-09-03 – 2012-09-05 (×4): 250 mg via ORAL
  Filled 2012-09-03 (×8): qty 1

## 2012-09-03 MED ORDER — PANTOPRAZOLE SODIUM 40 MG PO TBEC
40.0000 mg | DELAYED_RELEASE_TABLET | Freq: Every day | ORAL | Status: DC
  Administered 2012-09-03 – 2012-09-06 (×4): 40 mg via ORAL
  Filled 2012-09-03 (×3): qty 1

## 2012-09-03 MED ORDER — PROMETHAZINE HCL 25 MG/ML IJ SOLN: 12.5000 mg | INTRAMUSCULAR | Status: DC | PRN
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
## 2012-09-03 NOTE — Progress Notes (Signed)
RUQ tenderness W/U including labs and U/S. If this is unrevealing may need to add erythromycin as a pro-kinetic. Patient examined and I agree with the assessment and plan  Violeta Gelinas, MD, MPH, FACS Pager: [phone]  09/03/2012 10:33 AM
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
## 2012-09-03 NOTE — Progress Notes (Signed)
[facility]  Patient Status: Active (receiving services up to time of hospitalization)  [facility] is providing the following services: RN and PT  If patient discharges after hours, please call [phone].   Ray Clark 09/03/2012, 11:50 AM

## 2012-09-04 LAB — CBC
Hemoglobin: 10.2 g/dL — ABNORMAL LOW (ref 13.0–17.0)
MCH: 29.1 pg (ref 26.0–34.0)
MCV: 86.6 fL (ref 78.0–100.0)
RBC: 3.5 MIL/uL — ABNORMAL LOW (ref 4.22–5.81)

## 2012-09-04 LAB — HEPARIN LEVEL (UNFRACTIONATED): Heparin Unfractionated: 0.37 [IU]/mL (ref 0.30–0.70)

## 2012-09-04 LAB — GLUCOSE, CAPILLARY: Glucose-Capillary: 176 mg/dL — ABNORMAL HIGH (ref 70–99)

## 2012-09-04 MED ORDER — WARFARIN SODIUM 2 MG PO TABS
2.0000 mg | ORAL_TABLET | Freq: Once | ORAL | Status: AC
  Administered 2012-09-04: 2 mg via ORAL
  Filled 2012-09-04: qty 1

## 2012-09-04 MED ORDER — WARFARIN SODIUM 2.5 MG PO TABS
2.5000 mg | ORAL_TABLET | Freq: Once | ORAL | Status: DC
  Filled 2012-09-04: qty 1

## 2012-09-04 NOTE — Progress Notes (Signed)
Pt seen and examined  No n/v. Just doesn't have an appetite. Ate broth and cereal. Likes the protein shake  Alert, nad cta Reg Soft NT. Ostomy functioning and viable. Air in bag  Encouraged pt to drink 3-4 shakes as opposed to broth/jello Stop hep gtt Cont coumadin for PE Cont reglan/erythomycin  If po picks up - drinks shakes, hopefully home in next 2 days  [name]. Andrey Campanile, MD, FACS General, Bariatric, & Minimally Invasive Surgery [facility] Surgery, [place]
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
## 2012-09-05 LAB — CBC
HCT: 30.5 % — ABNORMAL LOW (ref 39.0–52.0)
MCH: 29 pg (ref 26.0–34.0)
MCHC: 33.4 g/dL (ref 30.0–36.0)
RDW: 13.8 % (ref 11.5–15.5)

## 2012-09-05 LAB — GLUCOSE, CAPILLARY
Glucose-Capillary: 228 mg/dL — ABNORMAL HIGH (ref 70–99)
Glucose-Capillary: 126 mg/dL — ABNORMAL HIGH (ref 70–99)

## 2012-09-05 LAB — PROTIME-INR: INR: 2.42 — ABNORMAL HIGH (ref 0.00–1.49)

## 2012-09-05 MED ORDER — METOCLOPRAMIDE HCL 5 MG PO TABS
5.0000 mg | ORAL_TABLET | Freq: Three times a day (TID) | ORAL | Status: DC
  Administered 2012-09-05 – 2012-09-06 (×5): 5 mg via ORAL
  Filled 2012-09-05 (×5): qty 1

## 2012-09-05 MED ORDER — WARFARIN SODIUM 2.5 MG PO TABS
2.5000 mg | ORAL_TABLET | Freq: Once | ORAL | Status: AC
  Administered 2012-09-05: 2.5 mg via ORAL
  Filled 2012-09-05: qty 1
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
## 2012-09-05 NOTE — Progress Notes (Signed)
Looks good. Drank 3 shakes and ate some food. Appetite for solids is still an issue. Tried to eat egg salad. Got N after 2 bites  Alert, nad Soft, nd.   D/c erythromycin Ok with po reglan Encouraged min 3 shakes - prefer 4 shakes per day.  Advised pt it may take several more weeks for appetite to return and all nausea to resolve Will plan on sending pt out with zofran as well If does well with medication tweaks today and eats well today, home Monday PCP will follow coumadin  [name]. Andrey Campanile, MD, FACS General, Bariatric, & Minimally Invasive Surgery [facility], [place]

## 2012-09-06 LAB — GLUCOSE, CAPILLARY: Glucose-Capillary: 182 mg/dL — ABNORMAL HIGH (ref 70–99)

## 2012-09-06 LAB — PROTIME-INR
INR: 2.03 — ABNORMAL HIGH (ref 0.00–1.49)
Prothrombin Time: 22.3 s — ABNORMAL HIGH (ref 11.6–15.2)

## 2012-09-06 LAB — CBC
HCT: 31.4 % — ABNORMAL LOW (ref 39.0–52.0)
Platelets: 425 10*3/uL — ABNORMAL HIGH (ref 150–400)
RDW: 13.5 % (ref 11.5–15.5)
WBC: 7.6 10*3/uL (ref 4.0–10.5)

## 2012-09-06 MED ORDER — METOCLOPRAMIDE HCL 5 MG PO TABS: 5.0000 mg | ORAL_TABLET | Freq: Three times a day (TID) | ORAL | Status: DC | PRN

## 2012-09-06 MED ORDER — MUPIROCIN 2 % EX OINT
TOPICAL_OINTMENT | CUTANEOUS | Status: AC
  Administered 2012-09-06: 10:00:00
  Filled 2012-09-06: qty 22

## 2012-09-06 MED ORDER — BACITRACIN-NEOMYCIN-POLYMYXIN 400-5-5000 EX OINT
TOPICAL_OINTMENT | CUTANEOUS | Status: AC
  Filled 2012-09-06: qty 1

## 2012-09-06 MED ORDER — WARFARIN SODIUM 2.5 MG PO TABS
2.5000 mg | ORAL_TABLET | Freq: Once | ORAL | Status: AC
  Administered 2012-09-06: 2.5 mg via ORAL
  Filled 2012-09-06: qty 1

## 2012-09-06 MED ORDER — WARFARIN SODIUM 5 MG PO TABS: ORAL_TABLET | ORAL | Status: DC

## 2012-09-06 MED ORDER — BOOST / RESOURCE BREEZE PO LIQD: 1.0000 | Freq: Three times a day (TID) | ORAL | Status: DC
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
## 2012-09-06 NOTE — Progress Notes (Signed)
Patient ID: Ray Clark, male   DOB: 1965/04/05, 47 y.o.   MRN: [id number]   LOS: 9 days   Subjective: Feeling better, appetite is slightly improved, especially with home food. No SOB. Feels ready to try home again.   Objective: Vital signs in last 24 hours: Temp:  [98.5 F (36.9 C)-98.7 F (37.1 C)] 98.6 F (37 C) (07/21 0628) Pulse Rate:  [84-92] 84 (07/21 0628) Resp:  [18] 18 (07/21 0628) BP: (94-104)/(59-62) 104/62 mmHg (07/21 0628) SpO2:  [100 %] 100 % (07/21 0628) Last BM Date: 09/05/12   Laboratory  Lab Results  Component Value Date   INR 2.42* 09/05/2012   INR 2.38* 09/04/2012   INR 2.44* 09/03/2012   CBG (last 3)   Recent Labs  09/05/12 1134 09/05/12 1713 09/05/12 2121  GLUCAP 228* 113* 126*    Physical Exam General appearance: alert and no distress Resp: clear to auscultation bilaterally Cardio: regular rate and rhythm GI: Soft, +BS, wound healing well, almost to skin level, colostomy bag in place w/stool   Assessment/Plan: Prolonged ileus Bilateral PE's -- Therapeutic on coumadin  Dispo -- Home   Freeman Caldron, PA-C Pager: [phone] General Trauma PA Pager: [phone]   09/06/2012
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
## 2012-09-06 NOTE — Progress Notes (Signed)
From the sound of it the patient should be able to go home.  Will see prior to discharge.  This patient has been seen and I agree with the findings and treatment plan.  Marta Lamas. Gae Bon, MD, FACS [phone] (pager) [phone] (direct pager) Trauma Surgeon
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
## 2012-09-06 NOTE — Progress Notes (Signed)
Pt. Discharged out of the unit via wheelchair.Instructions given to wife concerning his meds; she verbalized understanding.

## 2012-09-10 ENCOUNTER — Telehealth (INDEPENDENT_AMBULATORY_CARE_PROVIDER_SITE_OTHER): Payer: Self-pay

## 2012-09-10 ENCOUNTER — Encounter (INDEPENDENT_AMBULATORY_CARE_PROVIDER_SITE_OTHER): Payer: BC Managed Care – PPO

## 2012-09-10 NOTE — Telephone Encounter (Signed)
Left a message for patient letting him know that I had added him to clinic on 09/17/12, per Marisue Ivan, he needs to be seen.

## 2012-09-17 ENCOUNTER — Encounter (INDEPENDENT_AMBULATORY_CARE_PROVIDER_SITE_OTHER): Payer: Self-pay

## 2012-09-17 ENCOUNTER — Ambulatory Visit (INDEPENDENT_AMBULATORY_CARE_PROVIDER_SITE_OTHER): Payer: BC Managed Care – PPO | Admitting: Internal Medicine

## 2012-09-17 VITALS — BP 94/66 | HR 72 | Temp 98.2°F | Resp 14 | Ht 66.0 in | Wt 118.4 lb

## 2012-09-17 DIAGNOSIS — I2699 Other pulmonary embolism without acute cor pulmonale: Secondary | ICD-10-CM

## 2012-09-17 DIAGNOSIS — Z5189 Encounter for other specified aftercare: Secondary | ICD-10-CM

## 2012-09-17 DIAGNOSIS — S31139D Puncture wound of abdominal wall without foreign body, unspecified quadrant without penetration into peritoneal cavity, subsequent encounter: Secondary | ICD-10-CM
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
## 2012-10-28 ENCOUNTER — Encounter (INDEPENDENT_AMBULATORY_CARE_PROVIDER_SITE_OTHER): Payer: Self-pay | Admitting: General Surgery

## 2012-10-28 ENCOUNTER — Encounter (INDEPENDENT_AMBULATORY_CARE_PROVIDER_SITE_OTHER): Payer: BC Managed Care – PPO | Admitting: Surgery

## 2012-10-28 ENCOUNTER — Ambulatory Visit (INDEPENDENT_AMBULATORY_CARE_PROVIDER_SITE_OTHER): Payer: BC Managed Care – PPO | Admitting: General Surgery

## 2012-10-28 VITALS — BP 128/64 | HR 70 | Temp 98.0°F | Resp 18 | Ht 66.0 in | Wt 126.0 lb

## 2012-10-28 DIAGNOSIS — W3400XD Accidental discharge from unspecified firearms or gun, subsequent encounter: Secondary | ICD-10-CM

## 2012-10-28 DIAGNOSIS — Z5189 Encounter for other specified aftercare: Secondary | ICD-10-CM
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
## 2012-11-03 ENCOUNTER — Ambulatory Visit (INDEPENDENT_AMBULATORY_CARE_PROVIDER_SITE_OTHER): Payer: BC Managed Care – PPO | Admitting: General Surgery

## 2012-11-03 ENCOUNTER — Encounter (INDEPENDENT_AMBULATORY_CARE_PROVIDER_SITE_OTHER): Payer: Self-pay | Admitting: General Surgery

## 2012-11-03 VITALS — BP 118/82 | HR 84 | Temp 98.0°F | Resp 18 | Ht 66.0 in | Wt 126.4 lb

## 2012-11-03 DIAGNOSIS — S31109D Unspecified open wound of abdominal wall, unspecified quadrant without penetration into peritoneal cavity, subsequent encounter: Secondary | ICD-10-CM

## 2012-11-03 DIAGNOSIS — S36509D Unspecified injury of unspecified part of colon, subsequent encounter: Secondary | ICD-10-CM

## 2012-11-03 DIAGNOSIS — W3400XD Accidental discharge from unspecified firearms or gun, subsequent encounter: Secondary | ICD-10-CM

## 2012-11-03 DIAGNOSIS — Z5189 Encounter for other specified aftercare: Secondary | ICD-10-CM
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
## 2012-12-08 ENCOUNTER — Encounter (INDEPENDENT_AMBULATORY_CARE_PROVIDER_SITE_OTHER): Payer: Self-pay | Admitting: General Surgery

## 2012-12-08 ENCOUNTER — Ambulatory Visit (INDEPENDENT_AMBULATORY_CARE_PROVIDER_SITE_OTHER): Payer: BC Managed Care – PPO | Admitting: General Surgery

## 2012-12-08 VITALS — BP 110/78 | HR 84 | Resp 12 | Ht 66.0 in | Wt 127.4 lb

## 2012-12-08 DIAGNOSIS — S36509D Unspecified injury of unspecified part of colon, subsequent encounter: Secondary | ICD-10-CM

## 2012-12-08 DIAGNOSIS — Z5189 Encounter for other specified aftercare: Secondary | ICD-10-CM
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
## 2012-12-23 ENCOUNTER — Other Ambulatory Visit: Payer: Self-pay

## 2013-01-21 ENCOUNTER — Encounter (HOSPITAL_COMMUNITY): Payer: Self-pay | Admitting: Pharmacy Technician

## 2013-01-21 ENCOUNTER — Other Ambulatory Visit (INDEPENDENT_AMBULATORY_CARE_PROVIDER_SITE_OTHER): Payer: Self-pay | Admitting: General Surgery

## 2013-01-25 ENCOUNTER — Ambulatory Visit (HOSPITAL_COMMUNITY): Admission: RE | Admit: 2013-01-25 | Payer: BC Managed Care – PPO | Source: Ambulatory Visit

## 2013-01-25 ENCOUNTER — Encounter (HOSPITAL_COMMUNITY)
Admission: RE | Admit: 2013-01-25 | Discharge: 2013-01-25 | Disposition: A | Discharge status: Home / Self Care | Payer: BC Managed Care – PPO | Source: Ambulatory Visit | Attending: General Surgery | Admitting: General Surgery

## 2013-01-25 ENCOUNTER — Ambulatory Visit (HOSPITAL_COMMUNITY)
Admission: RE | Admit: 2013-01-25 | Discharge: 2013-01-25 | Disposition: A | Discharge status: Home / Self Care | Payer: BC Managed Care – PPO | Source: Ambulatory Visit | Attending: Anesthesiology | Admitting: Anesthesiology

## 2013-01-25 ENCOUNTER — Encounter (HOSPITAL_COMMUNITY): Payer: Self-pay

## 2013-01-25 DIAGNOSIS — E119 Type 2 diabetes mellitus without complications: Secondary | ICD-10-CM | POA: Insufficient documentation

## 2013-01-25 DIAGNOSIS — Z01812 Encounter for preprocedural laboratory examination: Secondary | ICD-10-CM | POA: Insufficient documentation

## 2013-01-25 DIAGNOSIS — Z01810 Encounter for preprocedural cardiovascular examination: Secondary | ICD-10-CM | POA: Insufficient documentation

## 2013-01-25 DIAGNOSIS — Z01818 Encounter for other preprocedural examination: Secondary | ICD-10-CM | POA: Insufficient documentation

## 2013-01-25 HISTORY — DX: Other pulmonary embolism without acute cor pulmonale: I26.99

## 2013-01-25 LAB — CBC
HCT: 40.5 % (ref 39.0–52.0)
Hemoglobin: 13.6 g/dL (ref 13.0–17.0)
MCH: 28.6 pg (ref 26.0–34.0)
MCHC: 33.6 g/dL (ref 30.0–36.0)
MCV: 85.3 fL (ref 78.0–100.0)
Platelets: 268 10*3/uL (ref 150–400)
RDW: 14.8 % (ref 11.5–15.5)
WBC: 7.1 10*3/uL (ref 4.0–10.5)

## 2013-01-25 LAB — BASIC METABOLIC PANEL
CO2: 28 mEq/L (ref 19–32)
Chloride: 102 mEq/L (ref 96–112)
Creatinine, Ser: 0.8 mg/dL (ref 0.50–1.35)
GFR calc Af Amer: >90 mL/min (ref >90)
GFR calc non Af Amer: >90 mL/min (ref >90)
Potassium: 4.1 mEq/L (ref 3.5–5.1)

## 2013-01-25 NOTE — Pre-Procedure Instructions (Addendum)
DALIN CALDERA  01/25/2013   Your procedure is scheduled on: Tuesday, December 16th.  Report to [facility], Main Entrance / Entrance "A" at 5:30 AM.  Call this number if you have problems the morning of surgery: [phone]   Remember:   Do not eat food or drink liquids after midnight.   Take these medicines the morning of surgery with A SIP OF WATER: None Stop taking Aspirin, Coumadin, Plavix, Effient and Herbal medications.  Do not take any NSAIDs ie: Ibuprofen,  Advil,Naproxen or any medication containing Aspirin.   Do not wear jewelry, make-up or nail polish.  Do not wear lotions, powders, or perfumes. You may wear deodorant.   Men may shave face and neck.  Do not bring valuables to the hospital.  [facility] is not responsible for any belongings or valuables.               Contacts, dentures or bridgework may not be worn into surgery.  Leave suitcase in the car. After surgery it may be brought to your room.  For patients admitted to the hospital, discharge time is determined by your treatment team.               Patients discharged the day of surgery will not be allowed to drive home.  Name and phone number of your driver: -   Special Instructions: Shower using CHG 2 nights before surgery and the night before surgery.  If you shower the day of surgery use CHG.  Use special wash - you have one bottle of CHG for all showers.  You should use approximately 1/3 of the bottle for each shower.   Please read over the following fact sheets that you were given: Pain Booklet, Coughing and Deep Breathing and Surgical Site Infection Prevention.   Per Britta Mccreedy At [facility]: Sunday have clear liquids, no red products. Monday morning begin TriLyte (mix with water)   Drink 1 glassful every 30 minutes until the entire gallon is finished.  The gallon should be finished around lunch time.  May rinse mouth out with sprite or ginger ale to get the taste of Trilyte out of your mouth.  Take  Neomycin and Ertythromycin at 3pm,6pm and 10pm on [place].
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
## 2013-01-31 MED ORDER — SODIUM CHLORIDE 0.9 % IV SOLN
1.0000 g | INTRAVENOUS | Status: AC
  Administered 2013-02-01: 1 g via INTRAVENOUS
  Filled 2013-01-31: qty 1

## 2013-02-01 ENCOUNTER — Encounter (HOSPITAL_COMMUNITY): Admission: RE | Disposition: A | Discharge status: Home / Self Care | Payer: Self-pay | Source: Ambulatory Visit

## 2013-02-01 ENCOUNTER — Inpatient Hospital Stay (HOSPITAL_COMMUNITY): Payer: BC Managed Care – PPO | Admitting: Anesthesiology

## 2013-02-01 ENCOUNTER — Inpatient Hospital Stay (HOSPITAL_COMMUNITY)
Admission: RE | Admit: 2013-02-01 | Discharge: 2013-02-07 | DRG: 330 | Disposition: A | Discharge status: Home / Self Care | Payer: BC Managed Care – PPO | Source: Ambulatory Visit | Attending: General Surgery | Admitting: General Surgery

## 2013-02-01 ENCOUNTER — Encounter (HOSPITAL_COMMUNITY): Payer: Self-pay | Admitting: Anesthesiology

## 2013-02-01 ENCOUNTER — Encounter (HOSPITAL_COMMUNITY): Payer: BC Managed Care – PPO | Admitting: Vascular Surgery

## 2013-02-01 DIAGNOSIS — E440 Moderate protein-calorie malnutrition: Secondary | ICD-10-CM | POA: Diagnosis present

## 2013-02-01 DIAGNOSIS — K560 Paralytic ileus: Secondary | ICD-10-CM | POA: Diagnosis not present

## 2013-02-01 DIAGNOSIS — F32A Depression, unspecified: Secondary | ICD-10-CM | POA: Diagnosis present

## 2013-02-01 DIAGNOSIS — Z79899 Other long term (current) drug therapy: Secondary | ICD-10-CM

## 2013-02-01 DIAGNOSIS — R51 Headache: Secondary | ICD-10-CM | POA: Diagnosis not present

## 2013-02-01 DIAGNOSIS — Y9210 Unspecified residential institution as the place of occurrence of the external cause: Secondary | ICD-10-CM | POA: Diagnosis present

## 2013-02-01 DIAGNOSIS — R110 Nausea: Secondary | ICD-10-CM | POA: Diagnosis not present

## 2013-02-01 DIAGNOSIS — F329 Major depressive disorder, single episode, unspecified: Secondary | ICD-10-CM | POA: Diagnosis present

## 2013-02-01 DIAGNOSIS — F3289 Other specified depressive episodes: Secondary | ICD-10-CM | POA: Diagnosis present

## 2013-02-01 DIAGNOSIS — Z433 Encounter for attention to colostomy: Principal | ICD-10-CM

## 2013-02-01 DIAGNOSIS — E119 Type 2 diabetes mellitus without complications: Secondary | ICD-10-CM | POA: Diagnosis present

## 2013-02-01 DIAGNOSIS — K929 Disease of digestive system, unspecified: Secondary | ICD-10-CM | POA: Diagnosis not present

## 2013-02-01 DIAGNOSIS — D62 Acute posthemorrhagic anemia: Secondary | ICD-10-CM | POA: Diagnosis not present

## 2013-02-01 DIAGNOSIS — Z9889 Other specified postprocedural states: Secondary | ICD-10-CM

## 2013-02-01 DIAGNOSIS — Z86711 Personal history of pulmonary embolism: Secondary | ICD-10-CM

## 2013-02-01 DIAGNOSIS — Z7901 Long term (current) use of anticoagulants: Secondary | ICD-10-CM

## 2013-02-01 DIAGNOSIS — K660 Peritoneal adhesions (postprocedural) (postinfection): Secondary | ICD-10-CM

## 2013-02-01 DIAGNOSIS — I2699 Other pulmonary embolism without acute cor pulmonale: Secondary | ICD-10-CM | POA: Diagnosis present

## 2013-02-01 DIAGNOSIS — Y838 Other surgical procedures as the cause of abnormal reaction of the patient, or of later complication, without mention of misadventure at the time of the procedure: Secondary | ICD-10-CM | POA: Diagnosis present

## 2013-02-01 HISTORY — PX: COLOSTOMY TAKEDOWN: SHX5783

## 2013-02-01 LAB — GLUCOSE, CAPILLARY
Glucose-Capillary: 115 mg/dL — ABNORMAL HIGH (ref 70–99)
Glucose-Capillary: 147 mg/dL — ABNORMAL HIGH (ref 70–99)
Glucose-Capillary: 161 mg/dL — ABNORMAL HIGH (ref 70–99)

## 2013-02-01 LAB — APTT: aPTT: 23 seconds — ABNORMAL LOW (ref 24–37)

## 2013-02-01 LAB — PROTIME-INR
INR: 1.04 (ref 0.00–1.49)
Prothrombin Time: 13.4 seconds (ref 11.6–15.2)

## 2013-02-01 SURGERY — CLOSURE, COLOSTOMY
Anesthesia: General | Site: Abdomen

## 2013-02-01 MED ORDER — PHENYLEPHRINE HCL 10 MG/ML IJ SOLN
INTRAMUSCULAR | Status: DC | PRN
  Administered 2013-02-01: 40 ug via INTRAVENOUS
  Administered 2013-02-01: 80 ug via INTRAVENOUS
  Administered 2013-02-01 (×2): 120 ug via INTRAVENOUS
  Administered 2013-02-01: 40 ug via INTRAVENOUS

## 2013-02-01 MED ORDER — HYDROMORPHONE HCL PF 1 MG/ML IJ SOLN
0.2500 mg | INTRAMUSCULAR | Status: DC | PRN
  Administered 2013-02-01 (×2): 0.5 mg via INTRAVENOUS

## 2013-02-01 MED ORDER — HEPARIN SODIUM (PORCINE) 5000 UNIT/ML IJ SOLN
5000.0000 [IU] | Freq: Three times a day (TID) | INTRAMUSCULAR | Status: DC
  Administered 2013-02-01 – 2013-02-02 (×2): 5000 [IU] via SUBCUTANEOUS
  Filled 2013-02-01 (×5): qty 1

## 2013-02-01 MED ORDER — LACTATED RINGERS IV SOLN
INTRAVENOUS | Status: DC | PRN
  Administered 2013-02-01 (×2): via INTRAVENOUS

## 2013-02-01 MED ORDER — SODIUM CHLORIDE 0.9 % IJ SOLN: 9.0000 mL | INTRAMUSCULAR | Status: DC | PRN

## 2013-02-01 MED ORDER — HYDROMORPHONE 0.3 MG/ML IV SOLN
INTRAVENOUS | Status: AC
  Administered 2013-02-01: 11:00:00
  Filled 2013-02-01: qty 25

## 2013-02-01 MED ORDER — OXYCODONE HCL 5 MG PO TABS: 5.0000 mg | ORAL_TABLET | Freq: Once | ORAL | Status: DC | PRN

## 2013-02-01 MED ORDER — ACETAMINOPHEN 10 MG/ML IV SOLN
INTRAVENOUS | Status: AC
  Administered 2013-02-01: 1000 mg
  Filled 2013-02-01: qty 100

## 2013-02-01 MED ORDER — 0.9 % SODIUM CHLORIDE (POUR BTL) OPTIME
TOPICAL | Status: DC | PRN
  Administered 2013-02-01 (×4): 1000 mL

## 2013-02-01 MED ORDER — INSULIN ASPART 100 UNIT/ML ~~LOC~~ SOLN
0.0000 [IU] | SUBCUTANEOUS | Status: DC
  Administered 2013-02-01: 2 [IU] via SUBCUTANEOUS
  Administered 2013-02-01 – 2013-02-02 (×2): 1 [IU] via SUBCUTANEOUS
  Administered 2013-02-02: 2 [IU] via SUBCUTANEOUS
  Administered 2013-02-02 (×2): 1 [IU] via SUBCUTANEOUS
  Administered 2013-02-02: 2 [IU] via SUBCUTANEOUS
  Administered 2013-02-03: 1 [IU] via SUBCUTANEOUS
  Administered 2013-02-03: 2 [IU] via SUBCUTANEOUS
  Administered 2013-02-03 – 2013-02-04 (×3): 1 [IU] via SUBCUTANEOUS

## 2013-02-01 MED ORDER — CHLORHEXIDINE GLUCONATE 4 % EX LIQD: 1.0000 "application " | Freq: Once | CUTANEOUS | Status: DC

## 2013-02-01 MED ORDER — ONDANSETRON HCL 4 MG/2ML IJ SOLN: 4.0000 mg | Freq: Four times a day (QID) | INTRAMUSCULAR | Status: DC | PRN

## 2013-02-01 MED ORDER — HYDROMORPHONE HCL PF 1 MG/ML IJ SOLN
INTRAMUSCULAR | Status: AC
  Filled 2013-02-01: qty 1

## 2013-02-01 MED ORDER — PROPOFOL 10 MG/ML IV BOLUS
INTRAVENOUS | Status: DC | PRN
  Administered 2013-02-01: 140 mg via INTRAVENOUS

## 2013-02-01 MED ORDER — ONDANSETRON HCL 4 MG PO TABS: 4.0000 mg | ORAL_TABLET | Freq: Four times a day (QID) | ORAL | Status: DC | PRN

## 2013-02-01 MED ORDER — ONDANSETRON HCL 4 MG/2ML IJ SOLN
INTRAMUSCULAR | Status: DC | PRN
  Administered 2013-02-01: 4 mg via INTRAVENOUS

## 2013-02-01 MED ORDER — HEPARIN SODIUM (PORCINE) 5000 UNIT/ML IJ SOLN
5000.0000 [IU] | Freq: Once | INTRAMUSCULAR | Status: AC
  Administered 2013-02-01: 5000 [IU] via SUBCUTANEOUS
  Filled 2013-02-01: qty 1

## 2013-02-01 MED ORDER — MIDAZOLAM HCL 5 MG/5ML IJ SOLN
INTRAMUSCULAR | Status: DC | PRN
  Administered 2013-02-01 (×2): 1 mg via INTRAVENOUS

## 2013-02-01 MED ORDER — NALOXONE HCL 0.4 MG/ML IJ SOLN: 0.4000 mg | INTRAMUSCULAR | Status: DC | PRN

## 2013-02-01 MED ORDER — PROMETHAZINE HCL 25 MG/ML IJ SOLN: 6.2500 mg | INTRAMUSCULAR | Status: DC | PRN

## 2013-02-01 MED ORDER — ROCURONIUM BROMIDE 100 MG/10ML IV SOLN
INTRAVENOUS | Status: DC | PRN
  Administered 2013-02-01: 10 mg via INTRAVENOUS
  Administered 2013-02-01: 40 mg via INTRAVENOUS

## 2013-02-01 MED ORDER — KCL IN DEXTROSE-NACL 20-5-0.45 MEQ/L-%-% IV SOLN
INTRAVENOUS | Status: DC
  Administered 2013-02-01 – 2013-02-04 (×6): via INTRAVENOUS
  Filled 2013-02-01 (×8): qty 1000

## 2013-02-01 MED ORDER — OXYCODONE HCL 5 MG/5ML PO SOLN: 5.0000 mg | Freq: Once | ORAL | Status: DC | PRN

## 2013-02-01 MED ORDER — INFLUENZA VAC SPLIT QUAD 0.5 ML IM SUSP
0.5000 mL | INTRAMUSCULAR | Status: DC
  Filled 2013-02-01 (×2): qty 0.5

## 2013-02-01 MED ORDER — ALVIMOPAN 12 MG PO CAPS
12.0000 mg | ORAL_CAPSULE | Freq: Once | ORAL | Status: AC
  Administered 2013-02-01: 12 mg via ORAL
  Filled 2013-02-01: qty 1

## 2013-02-01 MED ORDER — NEOSTIGMINE METHYLSULFATE 1 MG/ML IJ SOLN
INTRAMUSCULAR | Status: DC | PRN
  Administered 2013-02-01: 2 mg via INTRAVENOUS

## 2013-02-01 MED ORDER — HYDROMORPHONE 0.3 MG/ML IV SOLN
INTRAVENOUS | Status: DC
  Administered 2013-02-01 (×2): 1.79 mg via INTRAVENOUS
  Administered 2013-02-01: 0.4 mg via INTRAVENOUS
  Administered 2013-02-02: 0.2 mg via INTRAVENOUS
  Filled 2013-02-01: qty 25

## 2013-02-01 MED ORDER — VECURONIUM BROMIDE 10 MG IV SOLR
INTRAVENOUS | Status: DC | PRN
  Administered 2013-02-01: 2 mg via INTRAVENOUS
  Administered 2013-02-01: 1 mg via INTRAVENOUS

## 2013-02-01 MED ORDER — FENTANYL CITRATE 0.05 MG/ML IJ SOLN
INTRAMUSCULAR | Status: DC | PRN
  Administered 2013-02-01: 50 ug via INTRAVENOUS
  Administered 2013-02-01: 25 ug via INTRAVENOUS
  Administered 2013-02-01 (×3): 50 ug via INTRAVENOUS
  Administered 2013-02-01: 25 ug via INTRAVENOUS
  Administered 2013-02-01: 50 ug via INTRAVENOUS
  Administered 2013-02-01: 100 ug via INTRAVENOUS

## 2013-02-01 MED ORDER — DIPHENHYDRAMINE HCL 50 MG/ML IJ SOLN: 12.5000 mg | Freq: Four times a day (QID) | INTRAMUSCULAR | Status: DC | PRN

## 2013-02-01 MED ORDER — DIPHENHYDRAMINE HCL 12.5 MG/5ML PO ELIX: 12.5000 mg | ORAL_SOLUTION | Freq: Four times a day (QID) | ORAL | Status: DC | PRN

## 2013-02-01 MED ORDER — GLYCOPYRROLATE 0.2 MG/ML IJ SOLN
INTRAMUSCULAR | Status: DC | PRN
  Administered 2013-02-01: 0.4 mg via INTRAVENOUS

## 2013-02-01 MED ORDER — ALVIMOPAN 12 MG PO CAPS
12.0000 mg | ORAL_CAPSULE | Freq: Two times a day (BID) | ORAL | Status: DC
  Administered 2013-02-02 – 2013-02-04 (×6): 12 mg via ORAL
  Filled 2013-02-01 (×9): qty 1

## 2013-02-01 MED ORDER — MEPERIDINE HCL 25 MG/ML IJ SOLN: 6.2500 mg | INTRAMUSCULAR | Status: DC | PRN

## 2013-02-01 SURGICAL SUPPLY — 66 items
BLADE SURG ROTATE 9660 (MISCELLANEOUS) ×2 IMPLANT
CANISTER SUCTION 2500CC (MISCELLANEOUS) ×2 IMPLANT
CHLORAPREP W/TINT 26ML (MISCELLANEOUS) ×2 IMPLANT
COVER MAYO STAND STRL (DRAPES) ×4 IMPLANT
COVER SURGICAL LIGHT HANDLE (MISCELLANEOUS) ×2 IMPLANT
DRAPE LAPAROSCOPIC ABDOMINAL (DRAPES) ×2 IMPLANT
DRAPE PROXIMA HALF (DRAPES) IMPLANT
DRAPE UTILITY 15X26 W/TAPE STR (DRAPE) ×10 IMPLANT
DRAPE WARM FLUID 44X44 (DRAPE) ×2 IMPLANT
DRSG OPSITE POSTOP 4X10 (GAUZE/BANDAGES/DRESSINGS) ×2 IMPLANT
DRSG OPSITE POSTOP 4X8 (GAUZE/BANDAGES/DRESSINGS) IMPLANT
ELECT BLADE 6.5 EXT (BLADE) ×2 IMPLANT
ELECT CAUTERY BLADE 6.4 (BLADE) ×4 IMPLANT
ELECT REM PT RETURN 9FT ADLT (ELECTROSURGICAL) ×2
ELECTRODE REM PT RTRN 9FT ADLT (ELECTROSURGICAL) ×1 IMPLANT
GLOVE BIO SURGEON STRL SZ7.5 (GLOVE) ×4 IMPLANT
GLOVE BIO SURGEON STRL SZ8 (GLOVE) ×4 IMPLANT
GLOVE BIOGEL PI IND STRL 7.0 (GLOVE) ×4 IMPLANT
GLOVE BIOGEL PI IND STRL 7.5 (GLOVE) ×3 IMPLANT
GLOVE BIOGEL PI IND STRL 8 (GLOVE) ×2 IMPLANT
GLOVE BIOGEL PI INDICATOR 7.0 (GLOVE) ×4
GLOVE BIOGEL PI INDICATOR 7.5 (GLOVE) ×3
GLOVE BIOGEL PI INDICATOR 8 (GLOVE) ×2
GLOVE SURG ORTHO 8.0 STRL STRW (GLOVE) ×4 IMPLANT
GLOVE SURG SS PI 7.0 STRL IVOR (GLOVE) ×4 IMPLANT
GOWN STRL NON-REIN LRG LVL3 (GOWN DISPOSABLE) ×8 IMPLANT
GOWN STRL REIN XL XLG (GOWN DISPOSABLE) ×8 IMPLANT
KIT BASIN OR (CUSTOM PROCEDURE TRAY) ×2 IMPLANT
KIT ROOM TURNOVER OR (KITS) ×2 IMPLANT
LEGGING LITHOTOMY PAIR STRL (DRAPES) IMPLANT
LIGASURE IMPACT 36 18CM CVD LR (INSTRUMENTS) ×2 IMPLANT
NS IRRIG 1000ML POUR BTL (IV SOLUTION) ×8 IMPLANT
PACK GENERAL/GYN (CUSTOM PROCEDURE TRAY) ×2 IMPLANT
PAD ARMBOARD 7.5X6 YLW CONV (MISCELLANEOUS) ×2 IMPLANT
PENCIL BUTTON HOLSTER BLD 10FT (ELECTRODE) ×2 IMPLANT
RELOAD PROXIMATE 75MM BLUE (ENDOMECHANICALS) ×2 IMPLANT
SPECIMEN JAR LARGE (MISCELLANEOUS) ×2 IMPLANT
SPECIMEN JAR MEDIUM (MISCELLANEOUS) IMPLANT
SPONGE GAUZE 4X4 12PLY (GAUZE/BANDAGES/DRESSINGS) ×2 IMPLANT
SPONGE LAP 18X18 X RAY DECT (DISPOSABLE) ×2 IMPLANT
STAPLER GUN LINEAR PROX 60 (STAPLE) ×2 IMPLANT
STAPLER PROXIMATE 75MM BLUE (STAPLE) ×2 IMPLANT
STAPLER VISISTAT 35W (STAPLE) ×2 IMPLANT
SUCTION POOLE TIP (SUCTIONS) ×2 IMPLANT
SURGILUBE 2OZ TUBE FLIPTOP (MISCELLANEOUS) IMPLANT
SUT PDS AB 1 CT  36 (SUTURE) ×2
SUT PDS AB 1 CT 36 (SUTURE) ×2 IMPLANT
SUT PDS AB 1 TP1 54 (SUTURE) IMPLANT
SUT PDS AB 1 TP1 96 (SUTURE) ×4 IMPLANT
SUT PROLENE 2 0 CT2 30 (SUTURE) IMPLANT
SUT PROLENE 2 0 KS (SUTURE) IMPLANT
SUT SILK 2 0 SH CR/8 (SUTURE) ×2 IMPLANT
SUT SILK 2 0 TIES 10X30 (SUTURE) ×2 IMPLANT
SUT SILK 3 0 SH CR/8 (SUTURE) ×2 IMPLANT
SUT SILK 3 0 TIES 10X30 (SUTURE) ×2 IMPLANT
SYR BULB IRRIGATION 50ML (SYRINGE) ×2 IMPLANT
TAPE CLOTH SURG 4X10 WHT LF (GAUZE/BANDAGES/DRESSINGS) ×2 IMPLANT
TOWEL OR 17X24 6PK STRL BLUE (TOWEL DISPOSABLE) ×4 IMPLANT
TOWEL OR 17X26 10 PK STRL BLUE (TOWEL DISPOSABLE) ×2 IMPLANT
TOWEL OR NON WOVEN STRL DISP B (DISPOSABLE) ×4 IMPLANT
TRAY FOLEY CATH 14FRSI W/METER (CATHETERS) ×2 IMPLANT
TRAY PROCTOSCOPIC FIBER OPTIC (SET/KITS/TRAYS/PACK) IMPLANT
TUBE CONNECTING 12X1/4 (SUCTIONS) ×2 IMPLANT
UNDERPAD 30X30 INCONTINENT (UNDERPADS AND DIAPERS) IMPLANT
WATER STERILE IRR 1000ML POUR (IV SOLUTION) IMPLANT
YANKAUER SUCT BULB TIP NO VENT (SUCTIONS) ×4 IMPLANT
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
## 2013-02-01 NOTE — Interval H&P Note (Signed)
History and Physical Interval Note:  02/01/2013 6:55 AM  Ray Clark  has presented today for surgery, with the diagnosis of colostomy  The various methods of treatment have been discussed with the patient and family. After consideration of risks, benefits and other options for treatment, the patient has consented to  Procedure(s): COLOSTOMY TAKEDOWN (N/A) as a surgical intervention .  The patient's history has been reviewed, patient re-examined, no change in status, stable for surgery.  I have reviewed the patient's chart and labs.  Questions were answered to the patient's satisfaction.     Ray Clark E

## 2013-02-01 NOTE — Progress Notes (Signed)
UR completed.  Adeoluwa Silvers, RN BSN M[facility] CCM Trauma/Neuro ICU Case Manager [phone]  

## 2013-02-01 NOTE — Progress Notes (Signed)
Waiting on 6 north rn to call back

## 2013-02-01 NOTE — Anesthesia Postprocedure Evaluation (Signed)
Anesthesia Post Note  Patient: Ray Clark  Procedure(s) Performed: Procedure(s) (LRB): COLOSTOMY TAKEDOWN (N/A)  Anesthesia type: General  Patient location: PACU  Post pain: Pain level controlled  Post assessment: Post-op Vital signs reviewed  Last Vitals: BP 114/80  Pulse 83  Temp(Src) 36.1 C (Oral)  Resp 14  SpO2 100%  Post vital signs: Reviewed  Level of consciousness: sedated  Complications: No apparent anesthesia complications

## 2013-02-01 NOTE — Preoperative (Signed)
Beta Blockers   Reason not to administer Beta Blockers:Not Applicable 
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
## 2013-02-01 NOTE — Transfer of Care (Signed)
Immediate Anesthesia Transfer of Care Note  Patient: Ray Clark  Procedure(s) Performed: Procedure(s): COLOSTOMY TAKEDOWN (N/A)  Patient Location: PACU  Anesthesia Type:General  Level of Consciousness: sedated and patient cooperative  Airway & Oxygen Therapy: Patient Spontanous Breathing and Patient connected to nasal cannula oxygen  Post-op Assessment: Report given to PACU RN and Post -op Vital signs reviewed and stable  Post vital signs: Reviewed and stable  Complications: No apparent anesthesia complications

## 2013-02-01 NOTE — Anesthesia Preprocedure Evaluation (Addendum)
Anesthesia Evaluation  Patient identified by MRN, date of birth, ID band Patient unresponsive    Reviewed: Allergy & Precautions, H&P , NPO status , Patient's Chart, lab work & pertinent test results, Unable to perform ROS - Chart review only  History of Anesthesia Complications Negative for: history of anesthetic complications  Airway       Dental   Pulmonary neg pulmonary ROS,  breath sounds clear to auscultation  Pulmonary exam normal       Cardiovascular negative cardio ROS  Rhythm:Regular Rate:Normal     Neuro/Psych negative neurological ROS  negative psych ROS   GI/Hepatic negative GI ROS, Neg liver ROS,   Endo/Other  diabetes (glu 86), Well Controlled, Type 2, Oral Hypoglycemic Agents  Renal/GU negative Renal ROS     Musculoskeletal   Abdominal   Peds  Hematology  (+) Blood dyscrasia (Hb 9.9), anemia ,   Anesthesia Other Findings intubated  Reproductive/Obstetrics                           Anesthesia Physical  Anesthesia Plan  ASA: IV  Anesthesia Plan: General   Post-op Pain Management:    Induction: Inhalational  Airway Management Planned: Oral ETT  Additional Equipment: Arterial line  Intra-op Plan:   Post-operative Plan: Post-operative intubation/ventilation  Informed Consent: I have reviewed the patients History and Physical, chart, labs and discussed the procedure including the risks, benefits and alternatives for the proposed anesthesia with the patient or authorized representative who has indicated his/her understanding and acceptance.   History available from chart only  Plan Discussed with: CRNA and Surgeon  Anesthesia Plan Comments: (Plan routine monitors, existing A-line and ETT, GETA with post op ventilation )        Anesthesia Quick Evaluation

## 2013-02-02 DIAGNOSIS — I2699 Other pulmonary embolism without acute cor pulmonale: Secondary | ICD-10-CM

## 2013-02-02 DIAGNOSIS — F329 Major depressive disorder, single episode, unspecified: Secondary | ICD-10-CM | POA: Diagnosis present

## 2013-02-02 DIAGNOSIS — E1169 Type 2 diabetes mellitus with other specified complication: Secondary | ICD-10-CM

## 2013-02-02 LAB — CBC
HCT: 40.2 % (ref 39.0–52.0)
MCH: 29.4 pg (ref 26.0–34.0)
MCHC: 34.1 g/dL (ref 30.0–36.0)
Platelets: 241 10*3/uL (ref 150–400)
RBC: 4.66 MIL/uL (ref 4.22–5.81)
WBC: 10.6 10*3/uL — ABNORMAL HIGH (ref 4.0–10.5)

## 2013-02-02 LAB — BASIC METABOLIC PANEL
CO2: 28 mEq/L (ref 19–32)
Calcium: 8.4 mg/dL (ref 8.4–10.5)
Chloride: 100 mEq/L (ref 96–112)
GFR calc non Af Amer: >90 mL/min (ref >90)
Glucose, Bld: 152 mg/dL — ABNORMAL HIGH (ref 70–99)
Potassium: 4.2 mEq/L (ref 3.5–5.1)
Sodium: 134 mEq/L — ABNORMAL LOW (ref 135–145)

## 2013-02-02 LAB — GLUCOSE, CAPILLARY: Glucose-Capillary: 117 mg/dL — ABNORMAL HIGH (ref 70–99)

## 2013-02-02 MED ORDER — WARFARIN SODIUM 7.5 MG PO TABS
7.5000 mg | ORAL_TABLET | Freq: Once | ORAL | Status: AC
  Administered 2013-02-02: 7.5 mg via ORAL
  Filled 2013-02-02 (×2): qty 1

## 2013-02-02 MED ORDER — ESCITALOPRAM OXALATE 10 MG PO TABS
10.0000 mg | ORAL_TABLET | Freq: Every day | ORAL | Status: DC
  Administered 2013-02-02 – 2013-02-07 (×6): 10 mg via ORAL
  Filled 2013-02-02 (×6): qty 1

## 2013-02-02 MED ORDER — NALOXONE HCL 0.4 MG/ML IJ SOLN: 0.4000 mg | INTRAMUSCULAR | Status: DC | PRN

## 2013-02-02 MED ORDER — WARFARIN - PHARMACIST DOSING INPATIENT: Freq: Every day | Status: DC

## 2013-02-02 MED ORDER — ENOXAPARIN SODIUM 60 MG/0.6ML ~~LOC~~ SOLN
60.0000 mg | Freq: Two times a day (BID) | SUBCUTANEOUS | Status: DC
  Administered 2013-02-02 – 2013-02-04 (×6): 60 mg via SUBCUTANEOUS
  Filled 2013-02-02 (×8): qty 0.6

## 2013-02-02 MED ORDER — SODIUM CHLORIDE 0.9 % IJ SOLN: 9.0000 mL | INTRAMUSCULAR | Status: DC | PRN

## 2013-02-02 MED ORDER — DIPHENHYDRAMINE HCL 12.5 MG/5ML PO ELIX: 12.5000 mg | ORAL_SOLUTION | Freq: Four times a day (QID) | ORAL | Status: DC | PRN

## 2013-02-02 MED ORDER — DIPHENHYDRAMINE HCL 50 MG/ML IJ SOLN: 12.5000 mg | Freq: Four times a day (QID) | INTRAMUSCULAR | Status: DC | PRN

## 2013-02-02 MED ORDER — HYDROMORPHONE 0.3 MG/ML IV SOLN
INTRAVENOUS | Status: DC
  Administered 2013-02-02: 1.19 mg via INTRAVENOUS
  Administered 2013-02-02: 1.8 mg via INTRAVENOUS
  Administered 2013-02-02: 1.5 mg via INTRAVENOUS
  Administered 2013-02-03: 0.9 mg via INTRAVENOUS
  Administered 2013-02-03 (×2): via INTRAVENOUS
  Administered 2013-02-03: 0.9 mg via INTRAVENOUS
  Administered 2013-02-03: 2.1 mg via INTRAVENOUS
  Administered 2013-02-03: 1.5 mg via INTRAVENOUS
  Administered 2013-02-03: 0.9 mg via INTRAVENOUS
  Filled 2013-02-02 (×2): qty 25

## 2013-02-02 NOTE — Progress Notes (Signed)
Doing we.. Using PCA. I spoke to his family. Patient examined and I agree with the assessment and plan  Violeta Gelinas, MD, MPH, FACS Pager: [phone]  02/02/2013 3:13 PM

## 2013-02-02 NOTE — Progress Notes (Signed)
Patient ID: Ray Clark, male   DOB: [birth year], 47 y.o.   MRN: [id number]   LOS: 1 day  POD#1  Subjective: Denies N/V/flatus. Pain controlled with PCA. Some dizziness. Both pt and wife report problems with depressive symptoms including diet and sleep disturbance, anhedonia, and feelings of hopelessness. He denies obsessive reliving of the trauma and only minimal exaggerated startle response. They plan to seek psychiatric care soon.   Objective: Vital signs in last 24 hours: Temp:  [97 F (36.1 C)-99.2 F (37.3 C)] 98 F (36.7 C) (12/17 0516) Pulse Rate:  [81-113] 97 (12/17 0516) Resp:  [12-22] 17 (12/17 0626) BP: (109-143)/(68-91) 111/68 mmHg (12/17 0516) SpO2:  [88 %-100 %] 99 % (12/17 0626) FiO2 (%):  [28 %] 28 % (12/16 1403)    Laboratory  CBC  Recent Labs  02/02/13 0525  WBC 10.6*  HGB 13.7  HCT 40.2  PLT 241   BMET  Recent Labs  02/02/13 0525  NA 134*  K 4.2  CL 100  CO2 28  GLUCOSE 152*  BUN 8  CREATININE 0.84  CALCIUM 8.4   CBG (last 3)   Recent Labs  02/01/13 2343 02/02/13 0358 02/02/13 0732  GLUCAP 150* 160* 137*   Lab Results  Component Value Date   INR 1.04 02/01/2013   INR 2.03* 09/06/2012   INR 2.42* 09/05/2012    Physical Exam General appearance: alert and no distress Resp: clear to auscultation bilaterally Cardio: regular rate and rhythm GI: Soft, honeycomb dressing in place, ostomy site C/D/I, absent BS   Assessment/Plan: S/p colostomy takedown -- On Entereg, continue sips of clears Depression -- Will start Lexapro as this has the least interaction with coumadin among the SSRI's Hx/o PE's -- On SQH. Restart coumadin, change SQH to Lovenox in case he needs to go home with a bridge. DM -- Stable FEN -- No issues VTE -- As above Dispo -- Ileus    Freeman Caldron, PA-C Pager: [phone] General Trauma PA Pager: [phone]   02/02/2013
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
## 2013-02-03 ENCOUNTER — Encounter (HOSPITAL_COMMUNITY): Payer: Self-pay | Admitting: General Surgery

## 2013-02-03 LAB — BASIC METABOLIC PANEL
BUN: 7 mg/dL (ref 6–23)
Calcium: 8.8 mg/dL (ref 8.4–10.5)
Creatinine, Ser: 0.7 mg/dL (ref 0.50–1.35)
GFR calc Af Amer: >90 mL/min (ref >90)
GFR calc non Af Amer: >90 mL/min (ref >90)
Glucose, Bld: 138 mg/dL — ABNORMAL HIGH (ref 70–99)
Potassium: 4.4 mEq/L (ref 3.5–5.1)

## 2013-02-03 LAB — CBC WITH DIFFERENTIAL/PLATELET
Basophils Relative: 0 % (ref 0–1)
Eosinophils Absolute: 0 10*3/uL (ref 0.0–0.7)
Eosinophils Relative: 1 % (ref 0–5)
Hemoglobin: 12.3 g/dL — ABNORMAL LOW (ref 13.0–17.0)
Lymphs Abs: 0.7 10*3/uL (ref 0.7–4.0)
MCH: 28.7 pg (ref 26.0–34.0)
MCHC: 33.3 g/dL (ref 30.0–36.0)
MCV: 86.2 fL (ref 78.0–100.0)
Monocytes Relative: 10 % (ref 3–12)
Neutro Abs: 7 10*3/uL (ref 1.7–7.7)
Neutrophils Relative %: 81 % — ABNORMAL HIGH (ref 43–77)
Platelets: 227 10*3/uL (ref 150–400)
RBC: 4.28 MIL/uL (ref 4.22–5.81)

## 2013-02-03 LAB — GLUCOSE, CAPILLARY
Glucose-Capillary: 157 mg/dL — ABNORMAL HIGH (ref 70–99)
Glucose-Capillary: 144 mg/dL — ABNORMAL HIGH (ref 70–99)

## 2013-02-03 LAB — PROTIME-INR
INR: 1.28 (ref 0.00–1.49)
Prothrombin Time: 15.7 seconds — ABNORMAL HIGH (ref 11.6–15.2)

## 2013-02-03 MED ORDER — WARFARIN SODIUM 7.5 MG PO TABS
7.5000 mg | ORAL_TABLET | Freq: Once | ORAL | Status: AC
  Administered 2013-02-03: 7.5 mg via ORAL
  Filled 2013-02-03: qty 1

## 2013-02-03 MED ORDER — ACETAMINOPHEN 160 MG/5ML PO SOLN
650.0000 mg | Freq: Four times a day (QID) | ORAL | Status: DC | PRN
  Administered 2013-02-03: 650 mg via ORAL
  Filled 2013-02-03: qty 20.3

## 2013-02-03 NOTE — Progress Notes (Signed)
Agree that patient has minimal bowel sounds.  Would not advance diet.  He is on entereg.  More tender today and slightly more distended.  Will check CBC and Bmet.  This patient has been seen and I agree with the findings and treatment plan.  Marta Lamas. Gae Bon, MD, FACS [phone] (pager) [phone] (direct pager) Trauma Surgeon

## 2013-02-03 NOTE — Progress Notes (Signed)
ANTICOAGULATION CONSULT NOTE - Follow Up Consult  Pharmacy Consult:  Coumadin Indication:  History of PE  No Known Allergies  Patient Measurements: Height: 5\' 6"  (167.6 cm) Weight: 129 lb 4.8 oz (58.65 kg) IBW/kg (Calculated) : 63.8  Vital Signs: Temp: 98.9 F (37.2 C) (12/18 0551) Temp src: Oral (12/18 0551) BP: 108/77 mmHg (12/18 0551) Pulse Rate: 98 (12/18 0551)  Labs:  Recent Labs  02/01/13 0632 02/02/13 0525 02/02/13 1130 02/03/13 0700 02/03/13 1105  HGB  --  13.7  --   --  12.3*  HCT  --  40.2  --   --  36.9*  PLT  --  241  --   --  227  APTT 23*  --   --   --   --   LABPROT 13.4  --  15.0 15.7*  --   INR 1.04  --  1.21 1.28  --   CREATININE  --  0.84  --   --  0.70    Estimated Creatinine Clearance: 93.8 ml/min (by C-G formula based on Cr of 0.7).     Assessment: 34 YOM with history of PE on Coumadin PTA.  Coumadin was held for colostomy reversal and now resumed.  Lovenox started as bridge therapy until INR is therapeutic.  INR below goal at 1.28 and patient's renal function is stable.  No bleeding reported.   Goal of Therapy:  INR 2-3 Anti-Xa level 0.6-1 units/ml 4hrs after LMWH dose given Monitor platelets by anticoagulation protocol: Yes    Plan:  - Coumadin 7.5mg  PO today - Lovenox 60mg  SQ Q12H per MD - Daily PT / INR - F/U return of bowel function to d/c Entereg    Nakaya Mishkin D. Laney Potash, PharmD, BCPS Pager:  [phone] 02/03/2013, 1:19 PM
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
## 2013-02-03 NOTE — Progress Notes (Signed)
Patient ID: Ray Clark, male   DOB: [birth year], 47 y.o.   MRN: [id number]   LOS: 2 days  POD#2  Subjective: Denies N/V/flatus. Pain controlled.   Objective: Vital signs in last 24 hours: Temp:  [97.7 F (36.5 C)-98.9 F (37.2 C)] 98.9 F (37.2 C) (12/18 0551) Pulse Rate:  [98-114] 98 (12/18 0551) Resp:  [15-19] 18 (12/18 0551) BP: (100-116)/(74-79) 108/77 mmHg (12/18 0551) SpO2:  [92 %-100 %] 99 % (12/18 0551) FiO2 (%):  [1 %-43 %] 37 % (12/18 0359) Weight:  [129 lb 4.8 oz (58.65 kg)] 129 lb 4.8 oz (58.65 kg) (12/17 0900) Last BM Date: 02/01/13   Laboratory Results Lab Results  Component Value Date   INR 1.28 02/03/2013   INR 1.21 02/02/2013   INR 1.04 02/01/2013   CBG (last 3)   Recent Labs  02/03/13 0021 02/03/13 0420 02/03/13 0759  GLUCAP 136* 157* 144*    Physical Exam General appearance: alert and no distress Resp: clear to auscultation bilaterally Cardio: regular rate and rhythm GI: Soft, honeycomb dressing in place, minimal BS   Assessment/Plan: S/p colostomy takedown -- On Entereg, continue sips of clears  Depression -- Will start Lexapro as this has the least interaction with coumadin among the SSRI's  Hx/o PE's -- On Lovenox, coumadin DM -- Stable  FEN -- No issues  VTE -- As above  Dispo -- Ileus     Ray Caldron, PA-C Pager: [phone] General Trauma PA Pager: [phone]   02/03/2013

## 2013-02-04 LAB — GLUCOSE, CAPILLARY
Glucose-Capillary: 106 mg/dL — ABNORMAL HIGH (ref 70–99)
Glucose-Capillary: 138 mg/dL — ABNORMAL HIGH (ref 70–99)
Glucose-Capillary: 142 mg/dL — ABNORMAL HIGH (ref 70–99)
Glucose-Capillary: 160 mg/dL — ABNORMAL HIGH (ref 70–99)
Glucose-Capillary: 165 mg/dL — ABNORMAL HIGH (ref 70–99)
Glucose-Capillary: 171 mg/dL — ABNORMAL HIGH (ref 70–99)

## 2013-02-04 LAB — PROTIME-INR: Prothrombin Time: 20.4 seconds — ABNORMAL HIGH (ref 11.6–15.2)

## 2013-02-04 MED ORDER — WARFARIN SODIUM 7.5 MG PO TABS
7.5000 mg | ORAL_TABLET | Freq: Once | ORAL | Status: AC
  Administered 2013-02-04: 7.5 mg via ORAL
  Filled 2013-02-04: qty 1

## 2013-02-04 MED ORDER — HYDROCODONE-ACETAMINOPHEN 10-325 MG PO TABS
0.5000 | ORAL_TABLET | ORAL | Status: DC | PRN
  Filled 2013-02-04 (×2): qty 1

## 2013-02-04 MED ORDER — INSULIN ASPART 100 UNIT/ML ~~LOC~~ SOLN
0.0000 [IU] | Freq: Three times a day (TID) | SUBCUTANEOUS | Status: DC
  Administered 2013-02-04 (×2): 3 [IU] via SUBCUTANEOUS
  Administered 2013-02-05: 2 [IU] via SUBCUTANEOUS
  Administered 2013-02-05: 3 [IU] via SUBCUTANEOUS
  Administered 2013-02-05: 2 [IU] via SUBCUTANEOUS
  Administered 2013-02-06: 3 [IU] via SUBCUTANEOUS
  Administered 2013-02-06: 5 [IU] via SUBCUTANEOUS
  Administered 2013-02-07: 3 [IU] via SUBCUTANEOUS
  Administered 2013-02-07: 2 [IU] via SUBCUTANEOUS

## 2013-02-04 MED ORDER — METFORMIN HCL 500 MG PO TABS
500.0000 mg | ORAL_TABLET | Freq: Two times a day (BID) | ORAL | Status: DC
  Administered 2013-02-05 – 2013-02-07 (×4): 500 mg via ORAL
  Filled 2013-02-04 (×6): qty 1

## 2013-02-04 NOTE — Progress Notes (Signed)
Patient ID: Ray Clark, male   DOB: [birth year], 47 y.o.   MRN: [id number]   LOS: 3 days  POD#3  Subjective: Denies N/V. + flatus.   Objective: Vital signs in last 24 hours: Temp:  [97.9 F (36.6 C)-98.7 F (37.1 C)] 98.7 F (37.1 C) (12/19 0457) Pulse Rate:  [86-100] 99 (12/19 0457) Resp:  [10-20] 14 (12/19 0457) BP: (108-123)/(71-82) 121/80 mmHg (12/19 0457) SpO2:  [96 %-99 %] 98 % (12/19 0457) Last BM Date: 02/01/13   Laboratory Results Lab Results  Component Value Date   INR 1.80* 02/04/2013   INR 1.28 02/03/2013   INR 1.21 02/02/2013   CBG (last 3)   Recent Labs  02/03/13 2214 02/04/13 0034 02/04/13 0456  GLUCAP 165* 138* 142*    Physical Exam General appearance: alert and no distress Resp: clear to auscultation bilaterally Cardio: regular rate and rhythm GI: Soft but with some firmness around ostomy site, diminished BS, incisions C/D/I   Assessment/Plan: S/p colostomy takedown -- On Entereg Depression -- Will start Lexapro as this has the least interaction with coumadin among the SSRI's  Hx/o PE's -- On Lovenox, coumadin  DM -- Stable  FEN -- Will give clears, d/c PCA VTE -- As above  Dispo -- Ileus     Freeman Caldron, PA-C Pager: [phone] General Trauma PA Pager: [phone]   02/04/2013
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
## 2013-02-04 NOTE — Progress Notes (Signed)
ANTICOAGULATION CONSULT NOTE - Follow Up Consult  Pharmacy Consult:  Coumadin Indication:  History of PE  No Known Allergies  Patient Measurements: Height: 5\' 6"  (167.6 cm) Weight: 129 lb 4.8 oz (58.65 kg) IBW/kg (Calculated) : 63.8  Vital Signs: Temp: 98.6 F (37 C) (12/19 1030) Temp src: Oral (12/19 1030) BP: 121/84 mmHg (12/19 1030) Pulse Rate: 96 (12/19 1030)  Labs:  Recent Labs  02/02/13 0525 02/02/13 1130 02/03/13 0700 02/03/13 1105 02/04/13 0557  HGB 13.7  --   --  12.3*  --   HCT 40.2  --   --  36.9*  --   PLT 241  --   --  227  --   LABPROT  --  15.0 15.7*  --  20.4*  INR  --  1.21 1.28  --  1.80*  CREATININE 0.84  --   --  0.70  --     Estimated Creatinine Clearance: 93.8 ml/min (by C-G formula based on Cr of 0.7).     Assessment: 7 YOM with history of PE on Coumadin PTA.  Coumadin was held for colostomy reversal and now resumed.  Lovenox started as bridge therapy until INR is therapeutic.  INR of 1.8 is moving in the right direction after 2 doses of 7.5 mg. His renal function is stable.  No bleeding reported. His home coumadin dose is 5 mg daily.   Goal of Therapy:  INR 2-3 Anti-Xa level 0.6-1 units/ml 4hrs after LMWH dose given Monitor platelets by anticoagulation protocol: Yes    Plan:  - repeat Coumadin 7.5mg  PO today - Lovenox 60mg  SQ Q12H per MD - Daily PT / INR  Herby Abraham, Pharm.D. [phone] 02/04/2013 1:21 PM  - F/U return of bowel function to d/c Entereg

## 2013-02-04 NOTE — Progress Notes (Signed)
Although no bowel movement or flatus, he has great bowel sounds and his abdomen is much softer.  Agree with clear liquids.  The patient can also have black coffee.  The right side of his abdomen is a bit protuberant around ostomy site.  Will watch.  This patient has been seen and I agree with the findings and treatment plan.  Marta Lamas. Gae Bon, MD, FACS [phone] (pager) [phone] (direct pager) Trauma Surgeon

## 2013-02-05 LAB — PROTIME-INR
INR: 3.7 — ABNORMAL HIGH (ref 0.00–1.49)
INR: 3.78 — ABNORMAL HIGH (ref 0.00–1.49)
Prothrombin Time: 35.3 seconds — ABNORMAL HIGH (ref 11.6–15.2)
Prothrombin Time: 35.9 s — ABNORMAL HIGH (ref 11.6–15.2)

## 2013-02-05 LAB — GLUCOSE, CAPILLARY
Glucose-Capillary: 132 mg/dL — ABNORMAL HIGH (ref 70–99)
Glucose-Capillary: 154 mg/dL — ABNORMAL HIGH (ref 70–99)
Glucose-Capillary: 144 mg/dL — ABNORMAL HIGH (ref 70–99)

## 2013-02-05 NOTE — Progress Notes (Signed)
ANTICOAGULATION CONSULT NOTE - Follow Up Consult  Pharmacy Consult:  Coumadin Indication:  History of PE  No Known Allergies  Patient Measurements: Height: 5\' 6"  (167.6 cm) Weight: 129 lb 4.8 oz (58.65 kg) IBW/kg (Calculated) : 63.8  Vital Signs: Temp: 99.4 F (37.4 C) (12/20 0521) Temp src: Oral (12/20 0521) BP: 132/87 mmHg (12/20 0521) Pulse Rate: 93 (12/20 0521)  Labs:  Recent Labs  02/03/13 0700 02/03/13 1105 02/04/13 0557 02/05/13 0545  HGB  --  12.3*  --   --   HCT  --  36.9*  --   --   PLT  --  227  --   --   LABPROT 15.7*  --  20.4* 35.3*  INR 1.28  --  1.80* 3.70*  CREATININE  --  0.70  --   --     Estimated Creatinine Clearance: 93.8 ml/min (by C-G formula based on Cr of 0.7).     Assessment: 38 YOM with history of PE to continue on Coumadin.  INR supra-therapeutic today post Coumadin 7.5mg  PO daily x 3 doses (on 5mg  PO daily PTA).  High INR likely a combination of aggressive dosing and poor PO intake.  No bleeding reported and Lovenox discontinued.   Goal of Therapy:  INR 2 - 3 Monitor platelets by anticoagulation protocol: Yes    Plan:  - Hold Coumadin today - Agree with discontinuing Lovenox - Daily PT / INR     Shevonne Wolf D. Laney Potash, PharmD, BCPS Pager:  [phone] 02/05/2013, 11:05 AM

## 2013-02-05 NOTE — Progress Notes (Addendum)
4 Days Post-Op  Subjective: Had nausea last night but it passed. Had a few BMs. Burping still. Has a lot of gurgling in belly. Also has had sense of urgency to have BM  Objective: Vital signs in last 24 hours: Temp:  [98.6 F (37 C)-99.4 F (37.4 C)] 99.4 F (37.4 C) (12/20 0521) Pulse Rate:  [93-105] 93 (12/20 0521) Resp:  [15-20] 16 (12/20 0521) BP: (112-132)/(73-87) 132/87 mmHg (12/20 0521) SpO2:  [98 %-100 %] 100 % (12/20 0521) Last BM Date: 02/04/13  Intake/Output from previous day: 12/19 0701 - 12/20 0700 In: 0  Out: 453 [Urine:450; Stool:3] Intake/Output this shift:    Alert, nad cta b/l Reg Soft, nd, dressing c/d/i. RLQ old ostomy - protuberant, incision c/d/i - ?subcu hematoma No edema  Lab Results:   Recent Labs  02/03/13 1105  WBC 8.7  HGB 12.3*  HCT 36.9*  PLT 227   BMET  Recent Labs  02/03/13 1105  NA 133*  K 4.4  CL 98  CO2 31  GLUCOSE 138*  BUN 7  CREATININE 0.70  CALCIUM 8.8   PT/INR  Recent Labs  02/04/13 0557 02/05/13 0545  LABPROT 20.4* 35.3*  INR 1.80* 3.70*   ABG No results found for this basename: PHART, PCO2, PO2, HCO3,  in the last 72 hours  Studies/Results: No results found.  Anti-infectives: Anti-infectives   Start     Dose/Rate Route Frequency Ordered Stop   02/01/13 0600  ertapenem (INVANZ) 1 g in sodium chloride 0.9 % 50 mL IVPB     1 g 100 mL/hr over 30 Minutes Intravenous On call to O.R. 01/31/13 1502 02/01/13 0752      Assessment/Plan: Active Problems:   Pulmonary embolism, bilateral   S/P colostomy takedown   Depression  s/p Procedure(s): COLOSTOMY TAKEDOWN (N/A)  Appears bowels are waking up. Given nausea and burping, keep on clears this am; will check on later today, if improved will adv to full liquids H/o PE - on therapeutic lovenox, coumadin, INR 3.7; will defer dosing to pharmacy, d/c lovenox per pharmacy DM- blood sugars ok  [name]. Andrey Campanile, MD, FACS General, Bariatric, & Minimally  Invasive Surgery [facility], [place]   LOS: 4 days    Atilano Ina 02/05/2013

## 2013-02-06 LAB — GLUCOSE, CAPILLARY
Glucose-Capillary: 118 mg/dL — ABNORMAL HIGH (ref 70–99)
Glucose-Capillary: 235 mg/dL — ABNORMAL HIGH (ref 70–99)
Glucose-Capillary: 115 mg/dL — ABNORMAL HIGH (ref 70–99)

## 2013-02-06 LAB — CBC
MCH: 28.8 pg (ref 26.0–34.0)
MCHC: 34.4 g/dL (ref 30.0–36.0)
MCV: 83.7 fL (ref 78.0–100.0)
Platelets: 271 10*3/uL (ref 150–400)
RBC: 3.75 MIL/uL — ABNORMAL LOW (ref 4.22–5.81)

## 2013-02-06 LAB — PROTIME-INR: Prothrombin Time: 36.9 seconds — ABNORMAL HIGH (ref 11.6–15.2)

## 2013-02-06 NOTE — Progress Notes (Signed)
5 Days Post-Op  Subjective: Did well with clears. 1 episode of nausea. No bm yesterday but has flatus. Lots of questions this am  Objective: Vital signs in last 24 hours: Temp:  [98.5 F (36.9 C)-99.1 F (37.3 C)] 98.5 F (36.9 C) (12/21 0529) Pulse Rate:  [82-100] 94 (12/21 0529) Resp:  [18] 18 (12/21 0529) BP: (123-133)/(78-85) 123/78 mmHg (12/21 0529) SpO2:  [96 %-100 %] 100 % (12/21 0529) Last BM Date: 02/05/13  Intake/Output from previous day:   Intake/Output this shift:    Alert, nad cta b/l Reg Soft, nd, dressing c/d/i. +BS. Stable protuberance at old ostomy site - no cellulitis  Lab Results:   Recent Labs  02/03/13 1105 02/06/13 0512  WBC 8.7 6.2  HGB 12.3* 10.8*  HCT 36.9* 31.4*  PLT 227 271   BMET  Recent Labs  02/03/13 1105  NA 133*  K 4.4  CL 98  CO2 31  GLUCOSE 138*  BUN 7  CREATININE 0.70  CALCIUM 8.8   PT/INR  Recent Labs  02/05/13 0950 02/06/13 0512  LABPROT 35.9* 36.9*  INR 3.78* 3.92*   ABG No results found for this basename: PHART, PCO2, PO2, HCO3,  in the last 72 hours  Studies/Results: No results found.  Anti-infectives: Anti-infectives   Start     Dose/Rate Route Frequency Ordered Stop   02/01/13 0600  ertapenem (INVANZ) 1 g in sodium chloride 0.9 % 50 mL IVPB     1 g 100 mL/hr over 30 Minutes Intravenous On call to O.R. 01/31/13 1502 02/01/13 0752      Assessment/Plan: s/p Procedure(s): COLOSTOMY TAKEDOWN (N/A)  GI - adv to fulls and then adv diet as tolerated ABL anemia - hgb down from 12 to 10.8 today. Probably due to coumadin. Repeat cbc in am.  H/o PE - INR 3.92; lovenox off; coumadin dosing per pharmacy, prob hold dose this pm DM - blood sugars ok RLQ protuberance at former ostomy site - probable hematoma, observe  Ray Clark. Ray Campanile, MD, FACS General, Bariatric, & Minimally Invasive Surgery [facility], [place]   LOS: 5 days    Ray Clark 02/06/2013

## 2013-02-06 NOTE — Progress Notes (Signed)
ANTICOAGULATION CONSULT NOTE - Follow Up Consult  Pharmacy Consult:  Coumadin Indication:  History of PE  No Known Allergies  Patient Measurements: Height: 5\' 6"  (167.6 cm) Weight: 129 lb 4.8 oz (58.65 kg) IBW/kg (Calculated) : 63.8  Vital Signs: Temp: 98.5 F (36.9 C) (12/21 0529) Temp src: Oral (12/21 0529) BP: 123/78 mmHg (12/21 0529) Pulse Rate: 94 (12/21 0529)  Labs:  Recent Labs  02/03/13 1105  02/05/13 0545 02/05/13 0950 02/06/13 0512  HGB 12.3*  --   --   --  10.8*  HCT 36.9*  --   --   --  31.4*  PLT 227  --   --   --  271  LABPROT  --   < > 35.3* 35.9* 36.9*  INR  --   < > 3.70* 3.78* 3.92*  CREATININE 0.70  --   --   --   --   < > = values in this interval not displayed.  Estimated Creatinine Clearance: 93.8 ml/min (by C-G formula based on Cr of 0.7).     Assessment: Ray Clark with history of PE to continue on Coumadin.  INR of 3.92 supra-therapeutic today post Coumadin 7.5mg  PO daily x 3 doses and held yesterday (on 5mg  PO daily PTA).   No bleeding reported and Lovenox discontinued on 12/20.  Hg down to 10.8 from 12.3 3 days ago. No bleeding reported.   Goal of Therapy:  INR 2 - 3    Plan:  - Hold Coumadin again today - Daily PT / INR\ -am CBC  Herby Abraham, Pharm.D. [phone] 02/06/2013 10:35 AM

## 2013-02-07 LAB — GLUCOSE, CAPILLARY
Glucose-Capillary: 143 mg/dL — ABNORMAL HIGH (ref 70–99)
Glucose-Capillary: 154 mg/dL — ABNORMAL HIGH (ref 70–99)

## 2013-02-07 LAB — PROTIME-INR: INR: 3.19 — ABNORMAL HIGH (ref 0.00–1.49)

## 2013-02-07 LAB — CBC
HCT: 31.6 % — ABNORMAL LOW (ref 39.0–52.0)
MCH: 28.9 pg (ref 26.0–34.0)
MCHC: 34.5 g/dL (ref 30.0–36.0)
MCV: 83.8 fL (ref 78.0–100.0)
Platelets: 299 10*3/uL (ref 150–400)
RDW: 14.2 % (ref 11.5–15.5)
WBC: 6.6 10*3/uL (ref 4.0–10.5)

## 2013-02-07 MED ORDER — ONDANSETRON HCL 4 MG PO TABS: 4.0000 mg | ORAL_TABLET | Freq: Three times a day (TID) | ORAL | Status: DC | PRN

## 2013-02-07 MED ORDER — HYDROCODONE-ACETAMINOPHEN 5-325 MG PO TABS: 1.0000 | ORAL_TABLET | ORAL | Status: DC | PRN

## 2013-02-07 MED ORDER — ESCITALOPRAM OXALATE 10 MG PO TABS: 10.0000 mg | ORAL_TABLET | Freq: Every day | ORAL | Status: DC

## 2013-02-07 MED ORDER — BACITRACIN ZINC 500 UNIT/GM EX OINT
TOPICAL_OINTMENT | Freq: Two times a day (BID) | CUTANEOUS | Status: DC
  Administered 2013-02-07: 12:00:00 via TOPICAL
  Filled 2013-02-07: qty 15
  Filled 2013-02-07: qty 28.35

## 2013-02-07 MED ORDER — WARFARIN SODIUM 2.5 MG PO TABS
2.5000 mg | ORAL_TABLET | Freq: Once | ORAL | Status: AC
  Administered 2013-02-07: 2.5 mg via ORAL
  Filled 2013-02-07: qty 1

## 2013-02-07 NOTE — Progress Notes (Signed)
ANTICOAGULATION CONSULT NOTE - Follow Up Consult  Pharmacy Consult:  Coumadin Indication:  History of PE  No Known Allergies  Patient Measurements: Height: 5\' 6"  (167.6 cm) Weight: 129 lb 4.8 oz (58.65 kg) IBW/kg (Calculated) : 63.8  Vital Signs: Temp: 98.2 F (36.8 C) (12/22 1610) Temp src: Oral (12/21 2231) BP: 113/68 mmHg (12/22 0638) Pulse Rate: 80 (12/22 0638)  Labs:  Recent Labs  02/05/13 0950 02/06/13 0512 02/07/13 0725  HGB  --  10.8* 10.9*  HCT  --  31.4* 31.6*  PLT  --  271 299  LABPROT 35.9* 36.9* 31.5*  INR 3.78* 3.92* 3.19*    Estimated Creatinine Clearance: 93.8 ml/min (by C-G formula based on Cr of 0.7).     Assessment: 29 YOM with history of PE to continue on Coumadin.  INR decreased to 3.19 post Coumadin held x 2 days.  Concern that INR will decreased further in AM.  No bleeding reported. CBC stable.  Noted patient may be discharged today   Goal of Therapy:  INR 2 - 3    Plan:  - Coumadin 2.5mg  PO today, give prior to discharge - Recommend PT / INR check tomorrow to guide further dosing as degree of INR decrease will determine subsequent dosing.  If INR decreased to < 2, consider resuming home Coumadin dose (5mg  PO daily).  If INR between 2 - 2.5, give another Coumadin 2.5mg  PO x 1, then resume home dose. - Daily PT / INR    Ziah Turvey D. Laney Potash, PharmD, BCPS Pager:  [phone] 02/07/2013, 10:16 AM

## 2013-02-07 NOTE — Progress Notes (Signed)
Patient ID: Ray Clark, male   DOB: [birth year], 47 y.o.   MRN: [id number]   LOS: 6 days   Subjective: Denies N/V.   Objective: Vital signs in last 24 hours: Temp:  [98.2 F (36.8 C)-98.8 F (37.1 C)] 98.2 F (36.8 C) (12/22 7829) Pulse Rate:  [80-97] 80 (12/22 0638) Resp:  [17-18] 17 (12/22 0638) BP: (113-118)/(68-73) 113/68 mmHg (12/22 0638) SpO2:  [94 %-100 %] 100 % (12/22 0638) Last BM Date: 02/05/13   Laboratory  CBC  Recent Labs  02/06/13 0512 02/07/13 0725  WBC 6.2 6.6  HGB 10.8* 10.9*  HCT 31.4* 31.6*  PLT 271 299   Lab Results  Component Value Date   INR 3.19* 02/07/2013   INR 3.92* 02/06/2013   INR 3.78* 02/05/2013   CBG (last 3)   Recent Labs  02/06/13 1706 02/06/13 2234 02/07/13 0813  GLUCAP 158* 115* 143*    Physical Exam General appearance: alert and no distress Resp: clear to auscultation bilaterally Cardio: regular rate and rhythm GI: normal findings: bowel sounds normal and soft, non-tender   Assessment/Plan: S/p colostomy takedown -- On Entereg  Depression -- Will start Lexapro as this has the least interaction with coumadin among the SSRI's  Hx/o PE's -- On Lovenox, coumadin  DM -- Stable  FEN -- Give reg diet for lunch VTE -- As above  Dispo -- Home this afternoon if tolerates lunch    Freeman Caldron, PA-C Pager: [phone] General Trauma PA Pager: [phone]   02/07/2013

## 2013-02-07 NOTE — Progress Notes (Signed)
Doing well. Having BMs. Will see how he feels after lunch and hope to D/C today. May shower with assist. Patient examined and I agree with the assessment and plan  Violeta Gelinas, MD, MPH, FACS Pager: [phone]  02/07/2013 9:53 AM

## 2013-02-07 NOTE — Progress Notes (Addendum)
Discharge teaching was done with patient. He was given prescription norco to take to his pharmacy. Patient is waiting for his wife to pick him up. Patient also states that he has had the flu vaccine a month ago at his PCP and declined the flu vaccine here.
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
## 2013-02-08 NOTE — Discharge Summary (Signed)
Ray Mccabe, MD, MPH, FACS Pager: [phone]  

## 2013-02-16 ENCOUNTER — Ambulatory Visit (INDEPENDENT_AMBULATORY_CARE_PROVIDER_SITE_OTHER): Payer: BC Managed Care – PPO

## 2013-02-16 VITALS — Wt 118.4 lb

## 2013-02-16 DIAGNOSIS — Z9889 Other specified postprocedural states: Secondary | ICD-10-CM

## 2013-02-16 DIAGNOSIS — Z4802 Encounter for removal of sutures: Secondary | ICD-10-CM

## 2013-02-16 MED ORDER — HYDROCODONE-ACETAMINOPHEN 5-325 MG PO TABS: 1.0000 | ORAL_TABLET | ORAL | Status: DC | PRN

## 2013-02-16 NOTE — Patient Instructions (Signed)
Remove gauze packing and get in the shower.  Let the water and soap wash over the incision and opening.  Repack with saline moistened gauze twice a day.  Cover with dry gauze and tape.  Call with any further concerns such as redness of skin, unusual drainage, fever or bowels not moving.  Your appetite and strength will slowly get better.  Call with any questions or concerns.

## 2013-02-16 NOTE — Progress Notes (Signed)
Patient came in today for staple removal.  He was expecting to see Dr Janee Morn but I explained he was brought in for the nurse to remove his staples.  He is complaining of a lot of pain on his right side where the ostomy was.  It is swollen and hard in that area.  It has been draining dark blood.  He is unsure if he has had fever.  The midline incision is healed and he has been applying Neosporin.  He is eating and drinking ok and his bowels are moving ok.  I called in Dr Gerrit Friends to look at his right side incision.  Dr Gerrit Friends came in and removed the staples.  He opened up that incision because there was a hematoma inside.  He got most of the old blood out.  We packed the incision with wet to dry saline gauze.  The wife was instructed on how to do the dressing changes twice a day after showering.  She asked for more pain medicine for the pt.  The right side incision was packed and covered with dry gauze and tape.  I then removed the midline incision staples and applied benzoin and steri strips.  I instructed the patient and wife on dressing changes and incision care.  I had Dr Gerrit Friends sign for more Norco #30.  An appointment is made for 1/5 at 2 pm for the pt to see Dr Janee Morn for a reck.  I advised the wife and patient what to call us with if there were any problems.  I answered all their questions.

## 2013-02-21 ENCOUNTER — Encounter (INDEPENDENT_AMBULATORY_CARE_PROVIDER_SITE_OTHER): Payer: Self-pay | Admitting: General Surgery

## 2013-02-21 ENCOUNTER — Ambulatory Visit (INDEPENDENT_AMBULATORY_CARE_PROVIDER_SITE_OTHER): Payer: BC Managed Care – PPO | Admitting: General Surgery

## 2013-02-21 VITALS — BP 123/70 | HR 66 | Temp 97.7°F | Resp 18 | Ht 66.0 in | Wt 122.8 lb

## 2013-02-21 DIAGNOSIS — Z9889 Other specified postprocedural states: Secondary | ICD-10-CM

## 2013-02-21 DIAGNOSIS — Z4802 Encounter for removal of sutures: Secondary | ICD-10-CM

## 2013-02-21 MED ORDER — HYDROCODONE-ACETAMINOPHEN 5-325 MG PO TABS: 1.0000 | ORAL_TABLET | ORAL | Status: DC | PRN

## 2013-02-21 NOTE — Progress Notes (Signed)
Subjective:     Patient ID: Ray Clark, male   DOB: 11/19/1965, 48 y.o.   MRN: [id number]  HPI  Patient is status post colostomy takedown. He developed a hematoma at his ostomy site. This was opened previously in the office. His wife is doing much dressings. He is still having a lot of pain medial to the stoma site along his lower midline. Otherwise he is eating and moving his bowels well. He has a psychiatry referral from Dr. Ludwig ClarksMoreira but has not been yet. Review of Systems     Objective:   Physical Exam Awake and alert Abdomen is soft. Midline incision is well-healed. There is a small exposed tip of PDS suture which was trimmed. Ostomy site is open with clean granulation tissue. A new wet-to-dry dressing was placed. He had some fullness medial to this but felt possibly like a seroma. I attempted to aspirate but no significant fluid was there. No evidence of infection.    Assessment:     Coming on gradually status post colostomy takedown. Ostomy site hematoma improved    Plan:     I refilled his Norco. Continue daily wet-to-dry dressings. I asked him to speak with his primary regarding the psychiatry referral as I did not know a different psychiatrist in town. We will follow him up next week in trauma clinic and I will see him the following week.

## 2013-03-02 ENCOUNTER — Encounter (INDEPENDENT_AMBULATORY_CARE_PROVIDER_SITE_OTHER): Payer: Self-pay

## 2013-03-02 ENCOUNTER — Ambulatory Visit (INDEPENDENT_AMBULATORY_CARE_PROVIDER_SITE_OTHER): Payer: BC Managed Care – PPO | Admitting: Orthopedic Surgery

## 2013-03-02 VITALS — BP 120/68 | HR 88 | Temp 97.6°F | Resp 14 | Ht 66.0 in | Wt 121.8 lb

## 2013-03-02 DIAGNOSIS — Z4802 Encounter for removal of sutures: Secondary | ICD-10-CM

## 2013-03-02 DIAGNOSIS — Z9889 Other specified postprocedural states: Secondary | ICD-10-CM

## 2013-03-02 MED ORDER — HYDROCODONE-ACETAMINOPHEN 5-325 MG PO TABS: 1.0000 | ORAL_TABLET | ORAL | Status: DC | PRN

## 2013-03-02 NOTE — Patient Instructions (Signed)
Increase wet-to-dry dressing changes to 3 times/day.  Limit diet to full liquids. If your symptoms improve try to introduce regular foods again and see if they come back.

## 2013-03-02 NOTE — Progress Notes (Signed)
Subjective Ray Clark comes in s/p colostomy takedown. He has been treating his RLQ wound with wet-to-dry dressings once or twice daily. He is still having intermittent bouts of severe pain that he thinks are associated with eating. He had 1 e/o nausea with vomiting yesterday after eating a cheese sandwich. Denies any other episodes of this. Denies distension. He describes a feeling of food getting stuck somewhere in the periumbilical area. He is having normal bowel movements.   Objective ABD: Soft, mild-to-moderate TTP LLQ and RLQ, worse in RLQ. +BS. Fullness noted between ostomy site and midline but no discernible hernia. Ostomy site granulating but dusky with mild fibrinic material in base of wound. No odor on dressing. He has a suture tip that is barely protruding in his midline incision below his umbilicus. He has a likely small seroma lateral to the ostomy wound.      Assessment & Plan S/p colostomy reversal -- Given his symptoms I'm a bit worried about a partial SBO though I think he's just having a somewhat worse post-operative course than last time. I've asked him to resume a full liquid diet for the next week to see if that resolves the symptoms. I will refill his pain medicine. I've asked him to increase his wet-to-dry dressing changes to tid. He has an appointment to see Dr. Janee Mornhompson early next week. He may need a CT scan if he doesn't make some significant improvements soon.    Ray CaldronMichael J. Marjarie Irion, PA-C Pager: [phone] General Trauma PA Pager: [phone]

## 2013-03-09 ENCOUNTER — Ambulatory Visit (INDEPENDENT_AMBULATORY_CARE_PROVIDER_SITE_OTHER): Payer: BC Managed Care – PPO | Admitting: General Surgery

## 2013-03-09 ENCOUNTER — Encounter (INDEPENDENT_AMBULATORY_CARE_PROVIDER_SITE_OTHER): Payer: Self-pay | Admitting: General Surgery

## 2013-03-09 VITALS — BP 120/68 | HR 88 | Temp 98.1°F | Resp 14 | Ht 66.0 in | Wt 124.6 lb

## 2013-03-09 DIAGNOSIS — Z4802 Encounter for removal of sutures: Secondary | ICD-10-CM

## 2013-03-09 DIAGNOSIS — Z9889 Other specified postprocedural states: Secondary | ICD-10-CM

## 2013-03-09 MED ORDER — HYDROCODONE-ACETAMINOPHEN 5-325 MG PO TABS: 1.0000 | ORAL_TABLET | Freq: Three times a day (TID) | ORAL | Status: DC | PRN

## 2013-03-09 NOTE — Progress Notes (Signed)
Subjective:     Patient ID: Ray Clark, male   DOB: [birth year], 48 y.o.   MRN: [id number]  HPI  10 status post colostomy takedown. He is doing much better. He is a hematoma at his stoma site which was evacuated. This is granulating in well. He is doing wound care. Bowel movements are normal and daily. He is decreasing the frequency of pain medication. Review of Systems     Objective:   Physical Exam Midline wound is well-healed. No evidence of hernia. Stoma site has a 2 cm area of granulation tissue which is clean and very shallow. This was changed to just plain gauze    Assessment:     Coming along pretty well after colostomy takedown    Plan:     I refilled his pain medication. He will go down to every 8 hours. Change to plain gauze dressings daily on stoma site. Return in 2 weeks. I also discussed some gallbladder issues with his wife. She is seeing Dr. Loreta AveMann. She will make an appointment to see me in the future.

## 2013-03-24 ENCOUNTER — Ambulatory Visit (INDEPENDENT_AMBULATORY_CARE_PROVIDER_SITE_OTHER): Payer: BC Managed Care – PPO | Admitting: General Surgery

## 2013-03-24 ENCOUNTER — Encounter (INDEPENDENT_AMBULATORY_CARE_PROVIDER_SITE_OTHER): Payer: Self-pay | Admitting: General Surgery

## 2013-03-24 ENCOUNTER — Encounter (INDEPENDENT_AMBULATORY_CARE_PROVIDER_SITE_OTHER): Payer: Self-pay

## 2013-03-24 VITALS — BP 110/80 | HR 80 | Temp 97.9°F | Resp 18 | Ht 66.0 in | Wt 127.0 lb

## 2013-03-24 DIAGNOSIS — Z9889 Other specified postprocedural states: Secondary | ICD-10-CM
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
## 2013-04-13 ENCOUNTER — Encounter (INDEPENDENT_AMBULATORY_CARE_PROVIDER_SITE_OTHER): Payer: Self-pay | Admitting: General Surgery

## 2013-04-13 ENCOUNTER — Ambulatory Visit (INDEPENDENT_AMBULATORY_CARE_PROVIDER_SITE_OTHER): Payer: BC Managed Care – PPO | Admitting: General Surgery

## 2013-04-13 VITALS — BP 100/70 | HR 84 | Temp 98.2°F | Resp 15 | Ht 66.0 in | Wt 134.0 lb

## 2013-04-13 DIAGNOSIS — Z9889 Other specified postprocedural states: Secondary | ICD-10-CM

## 2013-04-13 DIAGNOSIS — Z4802 Encounter for removal of sutures: Secondary | ICD-10-CM

## 2013-04-13 MED ORDER — HYDROCODONE-ACETAMINOPHEN 5-325 MG PO TABS: 1.0000 | ORAL_TABLET | Freq: Three times a day (TID) | ORAL | Status: DC | PRN

## 2013-04-13 NOTE — Progress Notes (Signed)
Subjective:     Patient ID: Ray SavoyKashyap Clark, male   DOB: March 21, 1965, 48 y.o.   MRN: [id number]  HPI  Patient  Is following up after colostomy takedown. He is improving. He feels his ostomy site has closed. His pain is gradually improving. He continues to have symptoms of PTSD and is being treated by psychiatry. He has decreased the amount of Norco he is taking. He is working 4-5 hours a day. He traveled out of town by plane recently and tolerated that very well. Review of Systems     Objective:   Physical Exam Awake and alert Lungs clear to auscultation Heart regular Abdomen soft and not significantly tender. Midline an ostomy site wounds are well-healed. No hernias. He previously had a small exposed tip of PDS suture. This was trimmed in the past. I cannot feel it at this time.    Assessment:     Coming along well after colostomy takedown    Plan:     Continue to follow up with psychiatry for PTSD symptoms. This will be a long recovery process psychologically. Physically, he is doing very well. I refilled his Norco and advised him this will be the last prescription. He will gradually wean off of that. I will see him next month. I answered their questions.

## 2013-05-04 ENCOUNTER — Encounter (INDEPENDENT_AMBULATORY_CARE_PROVIDER_SITE_OTHER): Payer: BC Managed Care – PPO | Admitting: General Surgery

## 2013-05-18 ENCOUNTER — Ambulatory Visit (INDEPENDENT_AMBULATORY_CARE_PROVIDER_SITE_OTHER): Payer: BC Managed Care – PPO | Admitting: General Surgery

## 2013-05-18 ENCOUNTER — Encounter (INDEPENDENT_AMBULATORY_CARE_PROVIDER_SITE_OTHER): Payer: Self-pay | Admitting: General Surgery

## 2013-05-18 VITALS — BP 112/80 | HR 72 | Temp 97.5°F | Resp 14 | Ht 66.0 in | Wt 135.2 lb

## 2013-05-18 DIAGNOSIS — Z9889 Other specified postprocedural states: Secondary | ICD-10-CM
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
## 2013-06-06 ENCOUNTER — Emergency Department (HOSPITAL_COMMUNITY): Payer: BC Managed Care – PPO

## 2013-06-06 ENCOUNTER — Observation Stay (HOSPITAL_COMMUNITY)
Admission: EM | Admit: 2013-06-06 | Discharge: 2013-06-07 | Disposition: A | Discharge status: Home / Self Care | Payer: BC Managed Care – PPO | Attending: Internal Medicine | Admitting: Internal Medicine

## 2013-06-06 ENCOUNTER — Encounter (HOSPITAL_COMMUNITY): Payer: Self-pay | Admitting: Emergency Medicine

## 2013-06-06 DIAGNOSIS — I369 Nonrheumatic tricuspid valve disorder, unspecified: Secondary | ICD-10-CM

## 2013-06-06 DIAGNOSIS — M549 Dorsalgia, unspecified: Secondary | ICD-10-CM | POA: Insufficient documentation

## 2013-06-06 DIAGNOSIS — E785 Hyperlipidemia, unspecified: Secondary | ICD-10-CM | POA: Diagnosis present

## 2013-06-06 DIAGNOSIS — R059 Cough, unspecified: Secondary | ICD-10-CM | POA: Insufficient documentation

## 2013-06-06 DIAGNOSIS — Q2111 Secundum atrial septal defect: Secondary | ICD-10-CM

## 2013-06-06 DIAGNOSIS — Z9889 Other specified postprocedural states: Secondary | ICD-10-CM | POA: Insufficient documentation

## 2013-06-06 DIAGNOSIS — R42 Dizziness and giddiness: Secondary | ICD-10-CM | POA: Insufficient documentation

## 2013-06-06 DIAGNOSIS — J3489 Other specified disorders of nose and nasal sinuses: Secondary | ICD-10-CM | POA: Insufficient documentation

## 2013-06-06 DIAGNOSIS — Z86711 Personal history of pulmonary embolism: Secondary | ICD-10-CM | POA: Insufficient documentation

## 2013-06-06 DIAGNOSIS — J029 Acute pharyngitis, unspecified: Secondary | ICD-10-CM | POA: Insufficient documentation

## 2013-06-06 DIAGNOSIS — R55 Syncope and collapse: Principal | ICD-10-CM

## 2013-06-06 DIAGNOSIS — Z79899 Other long term (current) drug therapy: Secondary | ICD-10-CM | POA: Insufficient documentation

## 2013-06-06 DIAGNOSIS — R0789 Other chest pain: Secondary | ICD-10-CM | POA: Insufficient documentation

## 2013-06-06 DIAGNOSIS — E440 Moderate protein-calorie malnutrition: Secondary | ICD-10-CM

## 2013-06-06 DIAGNOSIS — Z933 Colostomy status: Secondary | ICD-10-CM | POA: Insufficient documentation

## 2013-06-06 DIAGNOSIS — F32A Depression, unspecified: Secondary | ICD-10-CM

## 2013-06-06 DIAGNOSIS — R0602 Shortness of breath: Secondary | ICD-10-CM | POA: Insufficient documentation

## 2013-06-06 DIAGNOSIS — F3289 Other specified depressive episodes: Secondary | ICD-10-CM

## 2013-06-06 DIAGNOSIS — F329 Major depressive disorder, single episode, unspecified: Secondary | ICD-10-CM

## 2013-06-06 DIAGNOSIS — E119 Type 2 diabetes mellitus without complications: Secondary | ICD-10-CM

## 2013-06-06 DIAGNOSIS — Q211 Atrial septal defect: Secondary | ICD-10-CM

## 2013-06-06 DIAGNOSIS — R05 Cough: Secondary | ICD-10-CM | POA: Insufficient documentation

## 2013-06-06 DIAGNOSIS — R1084 Generalized abdominal pain: Secondary | ICD-10-CM | POA: Insufficient documentation

## 2013-06-06 DIAGNOSIS — R079 Chest pain, unspecified: Secondary | ICD-10-CM

## 2013-06-06 DIAGNOSIS — I2699 Other pulmonary embolism without acute cor pulmonale: Secondary | ICD-10-CM

## 2013-06-06 DIAGNOSIS — M795 Residual foreign body in soft tissue: Secondary | ICD-10-CM | POA: Insufficient documentation

## 2013-06-06 DIAGNOSIS — J209 Acute bronchitis, unspecified: Secondary | ICD-10-CM | POA: Diagnosis present

## 2013-06-06 LAB — BASIC METABOLIC PANEL
BUN: 10 mg/dL (ref 6–23)
CO2: 23 mEq/L (ref 19–32)
Calcium: 8.1 mg/dL — ABNORMAL LOW (ref 8.4–10.5)
Chloride: 103 mEq/L (ref 96–112)
Creatinine, Ser: 0.96 mg/dL (ref 0.50–1.35)
GFR calc Af Amer: >90 mL/min (ref >90)
GFR calc non Af Amer: >90 mL/min (ref >90)
Glucose, Bld: 216 mg/dL — ABNORMAL HIGH (ref 70–99)
Potassium: 4.8 mEq/L (ref 3.7–5.3)
Sodium: 139 mEq/L (ref 137–147)

## 2013-06-06 LAB — URINALYSIS, ROUTINE W REFLEX MICROSCOPIC
BILIRUBIN URINE: NEGATIVE
GLUCOSE, UA: 250 mg/dL — AB
HGB URINE DIPSTICK: NEGATIVE
Ketones, ur: 15 mg/dL — AB
Leukocytes, UA: NEGATIVE
Nitrite: NEGATIVE
Protein, ur: NEGATIVE mg/dL
Specific Gravity, Urine: 1.021 (ref 1.005–1.030)
UROBILINOGEN UA: 0.2 mg/dL (ref 0.0–1.0)
pH: 6 (ref 5.0–8.0)

## 2013-06-06 LAB — CBC
HCT: 37.1 % — ABNORMAL LOW (ref 39.0–52.0)
Hemoglobin: 11.9 g/dL — ABNORMAL LOW (ref 13.0–17.0)
MCH: 26.4 pg (ref 26.0–34.0)
MCHC: 32.1 g/dL (ref 30.0–36.0)
MCV: 82.3 fL (ref 78.0–100.0)
Platelets: 185 10*3/uL (ref 150–400)
RBC: 4.51 MIL/uL (ref 4.22–5.81)
RDW: 15 % (ref 11.5–15.5)
WBC: 4.8 10*3/uL (ref 4.0–10.5)

## 2013-06-06 LAB — D-DIMER, QUANTITATIVE (NOT AT ARMC): D DIMER QUANT: 0.52 ug{FEU}/mL — AB (ref 0.00–0.48)

## 2013-06-06 LAB — I-STAT TROPONIN, ED: Troponin i, poc: 0 ng/mL (ref 0.00–0.08)

## 2013-06-06 LAB — GLUCOSE, CAPILLARY
Glucose-Capillary: 124 mg/dL — ABNORMAL HIGH (ref 70–99)
Glucose-Capillary: 184 mg/dL — ABNORMAL HIGH (ref 70–99)

## 2013-06-06 LAB — TROPONIN I: Troponin I: <0.3 ng/mL (ref <0.30)

## 2013-06-06 MED ORDER — DEPLIN 7.5 MG PO TABS: 7.5000 mg | ORAL_TABLET | Freq: Every day | ORAL | Status: DC

## 2013-06-06 MED ORDER — ENOXAPARIN SODIUM 40 MG/0.4ML ~~LOC~~ SOLN
40.0000 mg | SUBCUTANEOUS | Status: DC
  Administered 2013-06-06: 40 mg via SUBCUTANEOUS
  Filled 2013-06-06 (×2): qty 0.4

## 2013-06-06 MED ORDER — SODIUM CHLORIDE 0.9 % IV BOLUS (SEPSIS)
1000.0000 mL | Freq: Once | INTRAVENOUS | Status: AC
  Administered 2013-06-06: 1000 mL via INTRAVENOUS

## 2013-06-06 MED ORDER — SODIUM CHLORIDE 0.9 % IV SOLN
INTRAVENOUS | Status: DC
  Administered 2013-06-06 – 2013-06-07 (×3): via INTRAVENOUS

## 2013-06-06 MED ORDER — IOHEXOL 300 MG/ML  SOLN
100.0000 mL | Freq: Once | INTRAMUSCULAR | Status: AC | PRN
  Administered 2013-06-06: 100 mL via INTRAVENOUS

## 2013-06-06 MED ORDER — ESCITALOPRAM OXALATE 20 MG PO TABS
20.0000 mg | ORAL_TABLET | Freq: Every day | ORAL | Status: DC
  Administered 2013-06-06 – 2013-06-07 (×2): 20 mg via ORAL
  Filled 2013-06-06 (×2): qty 1

## 2013-06-06 MED ORDER — ASPIRIN 81 MG PO CHEW
81.0000 mg | CHEWABLE_TABLET | Freq: Every day | ORAL | Status: DC
  Administered 2013-06-06 – 2013-06-07 (×2): 81 mg via ORAL
  Filled 2013-06-06 (×2): qty 1

## 2013-06-06 MED ORDER — ONDANSETRON HCL 4 MG/2ML IJ SOLN
4.0000 mg | INTRAMUSCULAR | Status: DC | PRN
  Administered 2013-06-07: 4 mg via INTRAVENOUS
  Filled 2013-06-06: qty 2

## 2013-06-06 MED ORDER — HYDROCOD POLST-CHLORPHEN POLST 10-8 MG/5ML PO LQCR
5.0000 mL | Freq: Two times a day (BID) | ORAL | Status: DC | PRN
  Administered 2013-06-06 – 2013-06-07 (×2): 5 mL via ORAL
  Filled 2013-06-06 (×2): qty 5

## 2013-06-06 MED ORDER — MORPHINE SULFATE 2 MG/ML IJ SOLN: 1.0000 mg | INTRAMUSCULAR | Status: DC | PRN

## 2013-06-06 MED ORDER — PANTOPRAZOLE SODIUM 40 MG PO TBEC
40.0000 mg | DELAYED_RELEASE_TABLET | Freq: Every day | ORAL | Status: DC
  Administered 2013-06-06 – 2013-06-07 (×2): 40 mg via ORAL
  Filled 2013-06-06 (×2): qty 1

## 2013-06-06 MED ORDER — IOHEXOL 300 MG/ML  SOLN: 25.0000 mL | INTRAMUSCULAR | Status: DC

## 2013-06-06 MED ORDER — INSULIN ASPART 100 UNIT/ML ~~LOC~~ SOLN: 0.0000 [IU] | Freq: Three times a day (TID) | SUBCUTANEOUS | Status: DC

## 2013-06-06 MED ORDER — SALINE SPRAY 0.65 % NA SOLN
1.0000 | NASAL | Status: DC | PRN
  Filled 2013-06-06: qty 44

## 2013-06-06 MED ORDER — NITROGLYCERIN 0.4 MG SL SUBL: 0.4000 mg | SUBLINGUAL_TABLET | SUBLINGUAL | Status: DC | PRN

## 2013-06-06 MED ORDER — SODIUM CHLORIDE 0.9 % IV SOLN: INTRAVENOUS | Status: DC

## 2013-06-06 MED ORDER — INSULIN ASPART 100 UNIT/ML ~~LOC~~ SOLN: 0.0000 [IU] | Freq: Every day | SUBCUTANEOUS | Status: DC

## 2013-06-06 NOTE — ED Notes (Signed)
MD at bedside. 
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
## 2013-06-06 NOTE — ED Notes (Signed)
Pt is in CT

## 2013-06-06 NOTE — ED Notes (Signed)
Pt states some chest discomfort when he tried to get up, but currently resolved

## 2013-06-06 NOTE — Progress Notes (Signed)
Patient ID: Ray Clark, male   DOB: 03/29/1965, 48 y.o.   MRN: [id number] Social visit per patient request. He has been admitted after a syncope episode. CT A/P is OK. Work-up is ongoing per Dr. Donna BernardViyouh. Hopefully he was just dehydrated due to recent URI. I spoke with him and his wife. Violeta GelinasBurke Renu Asby, MD, MPH, FACS Trauma: [phone] General Surgery: [phone]
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
## 2013-06-06 NOTE — ED Notes (Signed)
Pt provided coffee per PA.
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
## 2013-06-06 NOTE — ED Notes (Signed)
Pt states that  He has been having what he believes is allergies: coughing, sneezing, running nose. Pt states that because of allergies he has had decreased appetite for about 2 days.
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
## 2013-06-06 NOTE — Progress Notes (Signed)
  Echocardiogram 2D Echocardiogram has been performed.  Lorn Junesngela D Katalyna Socarras 06/06/2013, 4:27 PM

## 2013-06-06 NOTE — ED Notes (Signed)
Pt taken to radiology

## 2013-06-06 NOTE — ED Notes (Signed)
Phlebotomy at bedside.

## 2013-06-07 ENCOUNTER — Observation Stay (HOSPITAL_COMMUNITY): Payer: BC Managed Care – PPO

## 2013-06-07 DIAGNOSIS — R079 Chest pain, unspecified: Secondary | ICD-10-CM

## 2013-06-07 DIAGNOSIS — E785 Hyperlipidemia, unspecified: Secondary | ICD-10-CM | POA: Diagnosis present

## 2013-06-07 DIAGNOSIS — J209 Acute bronchitis, unspecified: Secondary | ICD-10-CM | POA: Diagnosis present

## 2013-06-07 DIAGNOSIS — Q2111 Secundum atrial septal defect: Secondary | ICD-10-CM

## 2013-06-07 DIAGNOSIS — Q211 Atrial septal defect: Secondary | ICD-10-CM

## 2013-06-07 LAB — LIPID PANEL
Cholesterol: 107 mg/dL (ref 0–200)
HDL: 28 mg/dL — ABNORMAL LOW (ref >39)
LDL CALC: 40 mg/dL (ref 0–99)
TRIGLYCERIDES: 194 mg/dL — AB (ref <150)
Total CHOL/HDL Ratio: 3.8 RATIO
VLDL: 39 mg/dL (ref 0–40)

## 2013-06-07 LAB — GLUCOSE, CAPILLARY
Glucose-Capillary: 143 mg/dL — ABNORMAL HIGH (ref 70–99)
Glucose-Capillary: 113 mg/dL — ABNORMAL HIGH (ref 70–99)
Glucose-Capillary: 185 mg/dL — ABNORMAL HIGH (ref 70–99)

## 2013-06-07 LAB — TROPONIN I

## 2013-06-07 MED ORDER — TECHNETIUM TC 99M SESTAMIBI GENERIC - CARDIOLITE
10.0000 | Freq: Once | INTRAVENOUS | Status: AC | PRN
  Administered 2013-06-07: 10 via INTRAVENOUS

## 2013-06-07 MED ORDER — BENZONATATE 100 MG PO CAPS
100.0000 mg | ORAL_CAPSULE | Freq: Three times a day (TID) | ORAL | Status: DC | PRN
  Filled 2013-06-07: qty 1

## 2013-06-07 MED ORDER — LORATADINE 10 MG PO TABS: 10.0000 mg | ORAL_TABLET | Freq: Every day | ORAL | Status: DC | PRN

## 2013-06-07 MED ORDER — SALINE SPRAY 0.65 % NA SOLN: 1.0000 | NASAL | Status: DC | PRN

## 2013-06-07 MED ORDER — AZITHROMYCIN 500 MG PO TABS
500.0000 mg | ORAL_TABLET | ORAL | Status: DC
  Administered 2013-06-07: 500 mg via ORAL
  Filled 2013-06-07: qty 1

## 2013-06-07 MED ORDER — ASPIRIN 81 MG PO CHEW: 81.0000 mg | CHEWABLE_TABLET | Freq: Every day | ORAL | Status: DC

## 2013-06-07 MED ORDER — TECHNETIUM TC 99M SESTAMIBI - CARDIOLITE
30.0000 | Freq: Once | INTRAVENOUS | Status: AC | PRN
  Administered 2013-06-07: 30 via INTRAVENOUS

## 2013-06-07 MED ORDER — BENZONATATE 100 MG PO CAPS: 100.0000 mg | ORAL_CAPSULE | Freq: Three times a day (TID) | ORAL | Status: DC | PRN

## 2013-06-07 MED ORDER — REGADENOSON 0.4 MG/5ML IV SOLN
INTRAVENOUS | Status: AC
  Administered 2013-06-07: 0.4 mg via INTRAVENOUS
  Filled 2013-06-07: qty 5

## 2013-06-07 MED ORDER — AZITHROMYCIN 500 MG PO TABS: 500.0000 mg | ORAL_TABLET | Freq: Every day | ORAL | Status: DC

## 2013-06-07 MED ORDER — REGADENOSON 0.4 MG/5ML IV SOLN
INTRAVENOUS | Status: AC
  Filled 2013-06-07: qty 5

## 2013-06-07 MED ORDER — REGADENOSON 0.4 MG/5ML IV SOLN
0.4000 mg | Freq: Once | INTRAVENOUS | Status: AC
  Administered 2013-06-07: 0.4 mg via INTRAVENOUS

## 2013-06-07 MED ORDER — IOHEXOL 300 MG/ML  SOLN: 100.0000 mL | Freq: Once | INTRAMUSCULAR | Status: DC | PRN

## 2013-06-07 MED ORDER — IOHEXOL 350 MG/ML SOLN
80.0000 mL | Freq: Once | INTRAVENOUS | Status: AC | PRN
  Administered 2013-06-07: 100 mL via INTRAVENOUS

## 2013-06-07 MED ORDER — HYDROCOD POLST-CHLORPHEN POLST 10-8 MG/5ML PO LQCR: 5.0000 mL | Freq: Two times a day (BID) | ORAL | Status: DC | PRN

## 2013-06-07 NOTE — Discharge Instructions (Signed)
Atrial Septal Defect An atrial septal defect (ASD) is a hole in the heart. This hole is located in the thin tissue (septum) that separates the two upper chambers of the heart, the right and left atrium. This hole is present at birth (congenital). A few minutes after birth, this hole normally closes so that blood is not able to go between the right and left atrium.  Normally, blood from the right side of the heart is pumped to the lungs where the blood is oxygenated. The oxygenated blood from the lungs is then pumped to the left side of the heart. From the left side of the heart, blood is pumped out to the rest of the body. When an ASD occurs, blood from the left atrium mixes with blood in the right atrium. The blood is then recirculated to the lungs and left side of the heart. In other words, the blood makes the trip twice. An ASD makes the heart work harder by increasing the amount of blood in the right side of the heart. This causes heart overload and eventually weakens the heart's ability to pump.  CAUSES  The cause of ASD is not known. SIGNS AND SYMPTOMS  The symptoms of ASD vary depending upon the size of the hole and the amount of blood that goes into the right atrium. There may be no symptoms or symptoms may include:  Tiredness or fatigue.  Trouble breathing or shortness of breath.  Irregular heartbeats (arrhythmias).  An extra "swishing" or "whooshing" type sound (heart murmur) heard when listening to the heart. DIAGNOSIS  In order to diagnose ASD, tests will need to be performed. Some of the tests may include:  ECG. This records the electrical activity of your heart and traces the patterns of your heartbeat.  Chest X-ray exam.  MRI or CT scan.  Nuclear medicine blood flow study. This imaging test shows how much blood is being passed through the ASD.  Echocardiography. There are two types that may be used:  Transthoracic echocardiography (TTE). A TTE is very sensitive for  detecting the two most common types of ASD, ostium primum or ostium secundum. It is not as sensitive in detecting a less common form of ASD, sinus venosus.  Transesophageal echocardiography (TEE). A TEE is especially helpful in patients who have a thin or easily movable (mobile) septum, making ASD detection more accurate.  Cardiac catheterization. In this procedure, a small tube (catheter) is passed through a large vein in your neck or groin or arm. With this test, your health care provider can visualize your heart defect, check how well your heart is pumping, and check the function of your heart valves. TREATMENT   No treatment may be required if only a small amount of blood is moving back and forth (shunting) from the left to right atrium.  Minimally invasive closure may be done depending on the type and location of the ASD. Similar to the cardiac catheterization used to diagnose an ASD, this type of procedure is done in a cardiac catheterization lab. A catheter is inserted into a large blood vessel. The catheter is advanced to the ASD in the heart. A patch resembling an umbrella is threaded up the catheter and placed in the ASD hole. The patch is then "opened up" to close off the hole.  Open heart surgery may be necessary if minimally invasive closure cannot be done. If the ASD is small, the hole can be closed with stitches. If the ASD is large, a patch is  sewn over the defect so the hole is closed. SEEK IMMEDIATE MEDICAL CARE IF:  You experience unusual fatigue when exerting yourself.  You have chest pain at rest or with exertion.  You notice your fingertips or lips turning pale or blue. MAKE SURE YOU:   Understand these instructions.   Will watch your condition.  Will get help right away if you are not doing well or get worse.  Document Released: 09/18/2003 Document Revised: 11/24/2012 Document Reviewed: 10/11/2012 [facility] Patient Information 2014 [place], [place].  Chest Pain  (Nonspecific) It is often hard to give a specific diagnosis for the cause of chest pain. There is always a chance that your pain could be related to something serious, such as a heart attack or a blood clot in the lungs. You need to follow up with your caregiver for further evaluation. CAUSES   Heartburn.  Pneumonia or bronchitis.  Anxiety or stress.  Inflammation around your heart (pericarditis) or lung (pleuritis or pleurisy).  A blood clot in the lung.  A collapsed lung (pneumothorax). It can develop suddenly on its own (spontaneous pneumothorax) or from injury (trauma) to the chest.  Shingles infection (herpes zoster virus). The chest wall is composed of bones, muscles, and cartilage. Any of these can be the source of the pain.  The bones can be bruised by injury.  The muscles or cartilage can be strained by coughing or overwork.  The cartilage can be affected by inflammation and become sore (costochondritis). DIAGNOSIS  Lab tests or other studies, such as X-rays, electrocardiography, stress testing, or cardiac imaging, may be needed to find the cause of your pain.  TREATMENT   Treatment depends on what may be causing your chest pain. Treatment may include:  Acid blockers for heartburn.  Anti-inflammatory medicine.  Pain medicine for inflammatory conditions.  Antibiotics if an infection is present.  You may be advised to change lifestyle habits. This includes stopping smoking and avoiding alcohol, caffeine, and chocolate.  You may be advised to keep your head raised (elevated) when sleeping. This reduces the chance of acid going backward from your stomach into your esophagus.  Most of the time, nonspecific chest pain will improve within 2 to 3 days with rest and mild pain medicine. HOME CARE INSTRUCTIONS   If antibiotics were prescribed, take your antibiotics as directed. Finish them even if you start to feel better.  For the next few days, avoid physical activities  that bring on chest pain. Continue physical activities as directed.  Do not smoke.  Avoid drinking alcohol.  Only take over-the-counter or prescription medicine for pain, discomfort, or fever as directed by your caregiver.  Follow your caregiver's suggestions for further testing if your chest pain does not go away.  Keep any follow-up appointments you made. If you do not go to an appointment, you could develop lasting (chronic) problems with pain. If there is any problem keeping an appointment, you must call to reschedule. SEEK MEDICAL CARE IF:   You think you are having problems from the medicine you are taking. Read your medicine instructions carefully.  Your chest pain does not go away, even after treatment.  You develop a rash with blisters on your chest. SEEK IMMEDIATE MEDICAL CARE IF:   You have increased chest pain or pain that spreads to your arm, neck, jaw, back, or abdomen.  You develop shortness of breath, an increasing cough, or you are coughing up blood.  You have severe back or abdominal pain, feel nauseous, or vomit.  You develop severe weakness, fainting, or chills.  You have a fever. THIS IS AN EMERGENCY. Do not wait to see if the pain will go away. Get medical help at once. Call your local emergency services (911 in [place] Do not drive yourself to the hospital. MAKE SURE YOU:   Understand these instructions.  Will watch your condition.  Will get help right away if you are not doing well or get worse. Document Released: 11/13/2004 Document Revised: 04/28/2011 Document Reviewed: 09/09/2007 [facility] Patient Information 2014 [place], [place].  Syncope Syncope means a person passes out (faints). The person usually wakes up in less than 5 minutes. It is important to seek medical care for syncope. HOME CARE  Have someone stay with you until you feel normal.  Do not drive, use machines, or play sports until your doctor says it is okay.  Keep all doctor visits  as told.  Lie down when you feel like you might pass out. Take deep breaths. Wait until you feel normal before standing up.  Drink enough fluids to keep your pee (urine) clear or pale yellow.  If you take blood pressure or heart medicine, get up slowly. Take several minutes to sit and then stand. GET HELP RIGHT AWAY IF:   You have a severe headache.  You have pain in the chest, belly (abdomen), or back.  You are bleeding from the mouth or butt (rectum).  You have black or tarry poop (stool).  You have an irregular or very fast heartbeat.  You have pain with breathing.  You keep passing out, or you have shaking (seizures) when you pass out.  You pass out when sitting or lying down.  You feel confused.  You have trouble walking.  You have severe weakness.  You have vision problems. If you fainted, call your local emergency services (911 in [place] Do not drive yourself to the hospital. MAKE SURE YOU:   Understand these instructions.  Will watch your condition.  Will get help right away if you are not doing well or get worse. Document Released: 07/23/2007 Document Revised: 08/05/2011 Document Reviewed: 04/04/2011 [facility] Patient Information 2014 [place]Care, [place].

## 2013-06-07 NOTE — Progress Notes (Signed)
Pt has a slightly positive d-dimer at0.52. On call NP made aware. New orders for CTA. Sanda LingerJalesa R Zafira Munos, RN

## 2013-06-07 NOTE — Progress Notes (Signed)
Pt c/o of n/v. Pt given 4mg  of IV zofran. Will continue to monitor the pt. Sanda LingerJalesa R Josiel Gahm, RN

## 2013-06-07 NOTE — Progress Notes (Signed)
UR completed 
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
## 2013-06-09 NOTE — Progress Notes (Signed)
Reviewed discharge instructions with patient and wife, they stated his understanding.  Patient understands follow up with cardiology and primary care MD.  Discharged home with wife via wheelchair.  Ray Clark [name]

## 2013-06-17 ENCOUNTER — Ambulatory Visit (INDEPENDENT_AMBULATORY_CARE_PROVIDER_SITE_OTHER): Payer: BC Managed Care – PPO | Admitting: Cardiology

## 2013-06-17 ENCOUNTER — Encounter: Payer: Self-pay | Admitting: Cardiology

## 2013-06-17 ENCOUNTER — Encounter: Payer: Self-pay | Admitting: *Deleted

## 2013-06-17 VITALS — BP 114/60 | HR 79 | Ht 66.0 in | Wt 135.0 lb

## 2013-06-17 DIAGNOSIS — Q211 Atrial septal defect, unspecified: Secondary | ICD-10-CM

## 2013-06-17 DIAGNOSIS — E119 Type 2 diabetes mellitus without complications: Secondary | ICD-10-CM

## 2013-06-17 DIAGNOSIS — E785 Hyperlipidemia, unspecified: Secondary | ICD-10-CM

## 2013-06-17 DIAGNOSIS — Q2111 Secundum atrial septal defect: Secondary | ICD-10-CM
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
## 2013-06-17 NOTE — Patient Instructions (Addendum)
Your physician recommends that you continue on your current medications as directed. Please refer to the Current Medication list given to you today.  Your physician has requested that you have a TEE. During a TEE, sound waves are used to create images of your heart. It provides your doctor with information about the size and shape of your heart and how well your heart's chambers and valves are working. In this test, a transducer is attached to the end of a flexible tube that's guided down your throat and into your esophagus (the tube leading from you mouth to your stomach) to get a more detailed image of your heart. You are not awake for the procedure. Please see the instruction sheet given to you today. For further information please visit [email or web address] August 16, 2013 WITH DR Del[facility] SeeNELSON [facility] [place]  Your physician wants you to follow-up in: 1 YEAR WITH DR Johnell ComingsNELSON You will receive a reminder letter in the mail two months in advance. If you don't receive a letter, please call our office to schedule the follow-up appointment.

## 2013-06-29 ENCOUNTER — Encounter (INDEPENDENT_AMBULATORY_CARE_PROVIDER_SITE_OTHER): Payer: BC Managed Care – PPO | Admitting: General Surgery

## 2013-07-06 ENCOUNTER — Encounter (INDEPENDENT_AMBULATORY_CARE_PROVIDER_SITE_OTHER): Payer: Self-pay | Admitting: General Surgery

## 2013-07-06 ENCOUNTER — Ambulatory Visit (INDEPENDENT_AMBULATORY_CARE_PROVIDER_SITE_OTHER): Payer: No Typology Code available for payment source | Admitting: General Surgery

## 2013-07-06 VITALS — BP 110/76 | HR 74 | Temp 97.6°F | Resp 12 | Ht 66.0 in | Wt 134.8 lb

## 2013-07-06 DIAGNOSIS — Z9889 Other specified postprocedural states: Secondary | ICD-10-CM
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
## 2013-08-02 ENCOUNTER — Encounter (HOSPITAL_COMMUNITY): Payer: Self-pay | Admitting: Pharmacy Technician

## 2013-08-16 ENCOUNTER — Encounter (HOSPITAL_COMMUNITY)
Admission: RE | Disposition: A | Discharge status: Home / Self Care | Payer: Self-pay | Source: Ambulatory Visit | Attending: Cardiology

## 2013-08-16 ENCOUNTER — Encounter (HOSPITAL_COMMUNITY): Payer: Self-pay | Admitting: *Deleted

## 2013-08-16 ENCOUNTER — Ambulatory Visit (HOSPITAL_COMMUNITY)
Admission: RE | Admit: 2013-08-16 | Discharge: 2013-08-16 | Disposition: A | Discharge status: Home / Self Care | Payer: BC Managed Care – PPO | Source: Ambulatory Visit | Attending: Cardiology | Admitting: Cardiology

## 2013-08-16 DIAGNOSIS — E119 Type 2 diabetes mellitus without complications: Secondary | ICD-10-CM | POA: Insufficient documentation

## 2013-08-16 DIAGNOSIS — I080 Rheumatic disorders of both mitral and aortic valves: Secondary | ICD-10-CM | POA: Insufficient documentation

## 2013-08-16 DIAGNOSIS — I379 Nonrheumatic pulmonary valve disorder, unspecified: Secondary | ICD-10-CM | POA: Insufficient documentation

## 2013-08-16 DIAGNOSIS — Q2111 Secundum atrial septal defect: Secondary | ICD-10-CM

## 2013-08-16 DIAGNOSIS — Q211 Atrial septal defect, unspecified: Secondary | ICD-10-CM

## 2013-08-16 DIAGNOSIS — E785 Hyperlipidemia, unspecified: Secondary | ICD-10-CM | POA: Insufficient documentation

## 2013-08-16 DIAGNOSIS — Z7982 Long term (current) use of aspirin: Secondary | ICD-10-CM | POA: Insufficient documentation

## 2013-08-16 DIAGNOSIS — Z86711 Personal history of pulmonary embolism: Secondary | ICD-10-CM | POA: Insufficient documentation

## 2013-08-16 HISTORY — PX: TEE WITHOUT CARDIOVERSION: SHX5443

## 2013-08-16 LAB — GLUCOSE, CAPILLARY: Glucose-Capillary: 175 mg/dL — ABNORMAL HIGH (ref 70–99)

## 2013-08-16 SURGERY — ECHOCARDIOGRAM, TRANSESOPHAGEAL
Anesthesia: Moderate Sedation

## 2013-08-16 MED ORDER — LIDOCAINE VISCOUS 2 % MT SOLN
OROMUCOSAL | Status: DC | PRN
  Administered 2013-08-16: 10 mL via OROMUCOSAL

## 2013-08-16 MED ORDER — SODIUM CHLORIDE 0.9 % IV SOLN
INTRAVENOUS | Status: DC
  Administered 2013-08-16: 500 mL via INTRAVENOUS

## 2013-08-16 MED ORDER — BUTAMBEN-TETRACAINE-BENZOCAINE 2-2-14 % EX AERO
INHALATION_SPRAY | CUTANEOUS | Status: DC | PRN
  Administered 2013-08-16: 1 via TOPICAL

## 2013-08-16 MED ORDER — FENTANYL CITRATE 0.05 MG/ML IJ SOLN
INTRAMUSCULAR | Status: DC | PRN
  Administered 2013-08-16: 25 ug via INTRAVENOUS

## 2013-08-16 MED ORDER — FENTANYL CITRATE 0.05 MG/ML IJ SOLN
INTRAMUSCULAR | Status: AC
  Filled 2013-08-16: qty 2

## 2013-08-16 MED ORDER — LIDOCAINE VISCOUS 2 % MT SOLN
OROMUCOSAL | Status: AC
  Filled 2013-08-16: qty 15

## 2013-08-16 MED ORDER — MIDAZOLAM HCL 5 MG/ML IJ SOLN
INTRAMUSCULAR | Status: AC
  Filled 2013-08-16: qty 2

## 2013-08-16 MED ORDER — MIDAZOLAM HCL 10 MG/2ML IJ SOLN
INTRAMUSCULAR | Status: DC | PRN
  Administered 2013-08-16: 2 mg via INTRAVENOUS

## 2013-08-16 NOTE — CV Procedure (Signed)
    PROCEDURE NOTE  Procedure:  Transesophageal echocardiogram Operator:  Armanda Magicraci Turner, MD Indications:  Rule out ASD Complications:None IV Meds:Versed 2mg  IV, Fentanyl 25mcg IV  Results: Normall LV size with low normal LVF EF 50-55% Moderately dilated RV Moderately dilated RA Normal TV but could not assess for TR due to large wide jet from ASD Normal PV with trivial PR Normal MV with trivial MR Bicuspid AV with trivial AR Normal LA with no evidence of thrombus Interatrial septal defect c/w ASD with large shunt by colorflow doppler with left to right shunting and evidence of right to left shunting by agitated saline contrast injection. Normal Ascending and thoracic aorta  The patient tolerated the procedure well with no complications and was transferred back to his room in stable condition.

## 2013-08-16 NOTE — Progress Notes (Signed)
Echocardiogram Echocardiogram Transesophageal has been performed.  Dorothey BasemanReel, James M 08/16/2013, 9:06 AM
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
## 2013-08-16 NOTE — Discharge Instructions (Signed)
Transesophageal Echocardiogram °Transesophageal echocardiography (TEE) is a picture test of your heart using sound waves. The pictures taken can give very detailed pictures of your heart. This can help your doctor see if there are problems with your heart. TEE can check: °· If your heart has blood clots in it. °· How well your heart valves are working. °· If you have an infection on the inside of your heart. °· Some of the major arteries of your heart. °· If your heart valve is working after a repair. °· Your heart before a procedure that uses a shock to your heart to get the rhythm back to normal. °BEFORE THE PROCEDURE °· Do not eat or drink for 6 hours before the procedure or as told by your doctor. °· Make plans to have someone drive you home after the procedure. Do not drive yourself home. °· An IV tube will be put in your arm. °PROCEDURE °· You will be given a medicine to help you relax (sedative). It will be given through the IV tube. °· A numbing medicine will be sprayed or gargled in the back of your throat to help numb it. °· The tip of the probe is placed into the back of your mouth. You will be asked to swallow. This helps to pass the probe into your esophagus. °· Once the tip of the probe is in the right place, your doctor can take pictures of your heart. °· You may feel pressure at the back of your throat. °AFTER THE PROCEDURE °· You will be taken to a recovery area so the sedative can wear off. °· Your throat may be sore and scratchy. This will go away slowly over time. °· You will go home when you are fully awake and able to swallow liquids. °· You should have someone stay with you for the next 24 hours. °· Do not drive or operate machinery for the next 24 hours. °Document Released: 12/01/2008 Document Revised: 02/08/2013 Document Reviewed: 08/05/2012 °[employer]® Patient Information ©2015 [employer]. This information is not intended to replace advice given to you by your health care provider. Make  sure you discuss any questions you have with your health care provider. ° °

## 2013-08-16 NOTE — Interval H&P Note (Signed)
History and Physical Interval Note:  08/16/2013 8:12 AM  Ray NorfolkKashyap M Scheer  has presented today for surgery, with the diagnosis of atrial septal defect  The various methods of treatment have been discussed with the patient and family. After consideration of risks, benefits and other options for treatment, the patient has consented to  Procedure(s): TRANSESOPHAGEAL ECHOCARDIOGRAM (TEE) (N/A) as a surgical intervention .  The patient's history has been reviewed, patient examined, no change in status, stable for surgery.  I have reviewed the patient's chart and labs.  Questions were answered to the patient's satisfaction.     TURNER,TRACI R

## 2013-08-17 ENCOUNTER — Encounter (HOSPITAL_COMMUNITY): Payer: Self-pay | Admitting: Cardiology

## 2013-08-18 ENCOUNTER — Telehealth: Payer: Self-pay | Admitting: *Deleted

## 2013-08-18 ENCOUNTER — Encounter: Payer: Self-pay | Admitting: Cardiology

## 2013-08-18 DIAGNOSIS — Q211 Atrial septal defect, unspecified: Secondary | ICD-10-CM

## 2013-08-18 NOTE — Telephone Encounter (Signed)
Message copied by Loa SocksMARTIN, Delle Andrzejewski M on Thu Aug 18, 2013  9:39 AM ------      Message from: Lars MassonNELSON, KATARINA H      Created: Wed Aug 17, 2013  5:42 PM       This patient has ASD, I tried to call him but he is not answering.      Please let him know that I talked to Dr Excell Seltzerooper, the interventional cardiologist who will be happy to close his ASD (intervention not surgery).      We need some additional images for the procedure, therefore we will order cardiac CT.      Please order cardiac CT with me and an outpatient appointment with Dr Excell Seltzerooper.      Thank you,      K       ------
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
## 2013-08-23 ENCOUNTER — Ambulatory Visit (HOSPITAL_COMMUNITY): Payer: BC Managed Care – PPO

## 2013-08-23 ENCOUNTER — Ambulatory Visit (HOSPITAL_COMMUNITY)
Admission: RE | Admit: 2013-08-23 | Discharge: 2013-08-23 | Disposition: A | Discharge status: Home / Self Care | Payer: BC Managed Care – PPO | Source: Ambulatory Visit | Attending: Cardiology | Admitting: Cardiology

## 2013-08-23 DIAGNOSIS — Q211 Atrial septal defect, unspecified: Secondary | ICD-10-CM

## 2013-08-23 DIAGNOSIS — Q2111 Secundum atrial septal defect: Secondary | ICD-10-CM

## 2013-08-23 MED ORDER — METOPROLOL TARTRATE 1 MG/ML IV SOLN
INTRAVENOUS | Status: AC
  Filled 2013-08-23: qty 5

## 2013-08-23 MED ORDER — IOHEXOL 350 MG/ML SOLN
80.0000 mL | Freq: Once | INTRAVENOUS | Status: AC | PRN
  Administered 2013-08-23: 80 mL via INTRAVENOUS

## 2013-08-23 MED ORDER — NITROGLYCERIN 0.4 MG SL SUBL
SUBLINGUAL_TABLET | SUBLINGUAL | Status: AC
  Filled 2013-08-23: qty 1

## 2013-08-23 MED ORDER — METOPROLOL TARTRATE 1 MG/ML IV SOLN
2.5000 mg | Freq: Once | INTRAVENOUS | Status: AC
  Administered 2013-08-23: 2.5 mg via INTRAVENOUS
  Filled 2013-08-23: qty 5

## 2013-08-23 MED ORDER — METOPROLOL TARTRATE 1 MG/ML IV SOLN
2.5000 mg | Freq: Once | INTRAVENOUS | Status: DC
  Filled 2013-08-23: qty 5

## 2013-08-23 MED ORDER — NITROGLYCERIN 0.4 MG SL SUBL
0.4000 mg | SUBLINGUAL_TABLET | SUBLINGUAL | Status: DC | PRN
  Administered 2013-08-23: 0.4 mg via SUBLINGUAL
  Filled 2013-08-23: qty 25

## 2013-08-23 NOTE — Progress Notes (Signed)
Review holding metformin medication for 48 hours with pt and spouse

## 2013-08-24 ENCOUNTER — Encounter (INDEPENDENT_AMBULATORY_CARE_PROVIDER_SITE_OTHER): Payer: No Typology Code available for payment source | Admitting: General Surgery

## 2013-09-09 ENCOUNTER — Encounter (INDEPENDENT_AMBULATORY_CARE_PROVIDER_SITE_OTHER): Payer: Self-pay | Admitting: General Surgery

## 2013-09-09 ENCOUNTER — Ambulatory Visit (INDEPENDENT_AMBULATORY_CARE_PROVIDER_SITE_OTHER): Payer: BC Managed Care – PPO | Admitting: General Surgery

## 2013-09-09 VITALS — BP 116/80 | HR 84 | Resp 16 | Ht 66.0 in | Wt 139.4 lb

## 2013-09-09 DIAGNOSIS — Z9889 Other specified postprocedural states: Secondary | ICD-10-CM
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
## 2013-09-15 ENCOUNTER — Ambulatory Visit: Payer: BC Managed Care – PPO | Admitting: Cardiovascular Disease

## 2013-09-15 ENCOUNTER — Encounter: Payer: Self-pay | Admitting: Cardiovascular Disease

## 2013-09-15 ENCOUNTER — Encounter: Payer: Self-pay | Admitting: *Deleted

## 2013-09-15 VITALS — BP 102/80 | HR 80 | Ht 66.0 in | Wt 141.6 lb

## 2013-09-15 DIAGNOSIS — Z01812 Encounter for preprocedural laboratory examination: Secondary | ICD-10-CM

## 2013-09-15 DIAGNOSIS — E119 Type 2 diabetes mellitus without complications: Secondary | ICD-10-CM | POA: Diagnosis not present

## 2013-09-15 MED ORDER — CLOPIDOGREL BISULFATE 75 MG PO TABS: 75.0000 mg | ORAL_TABLET | Freq: Every day | ORAL | Status: DC

## 2013-09-15 NOTE — Patient Instructions (Signed)
Your physician has recommended you make the following change in your medication:  1.) begin plavix 75 mg daily on October 12, 2013  You are scheduled for a repair of atrial septal defect on 10/17/2013 with Dr. Excell Seltzerooper.  Your physician recommends that you return for lab work on approximately 10/10/2013. These are pre procedure labs.  You do not have to be fasting.

## 2013-09-16 ENCOUNTER — Encounter: Payer: Self-pay | Admitting: Cardiovascular Disease
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
## 2013-10-07 ENCOUNTER — Encounter (HOSPITAL_COMMUNITY): Payer: Self-pay | Admitting: Pharmacy Technician

## 2013-10-10 ENCOUNTER — Other Ambulatory Visit (INDEPENDENT_AMBULATORY_CARE_PROVIDER_SITE_OTHER): Payer: BC Managed Care – PPO

## 2013-10-10 DIAGNOSIS — Z01812 Encounter for preprocedural laboratory examination: Secondary | ICD-10-CM

## 2013-10-10 LAB — BASIC METABOLIC PANEL
BUN: 11 mg/dL (ref 6–23)
CALCIUM: 9.3 mg/dL (ref 8.4–10.5)
CO2: 30 mEq/L (ref 19–32)
CREATININE: 0.9 mg/dL (ref 0.4–1.5)
Chloride: 100 mEq/L (ref 96–112)
GFR: 95.31 mL/min (ref >60.00)
Glucose, Bld: 117 mg/dL — ABNORMAL HIGH (ref 70–99)
Potassium: 4.6 mEq/L (ref 3.5–5.1)
Sodium: 138 mEq/L (ref 135–145)

## 2013-10-10 LAB — CBC WITH DIFFERENTIAL/PLATELET
BASOS ABS: 0.1 10*3/uL (ref 0.0–0.1)
Basophils Relative: 1.2 % (ref 0.0–3.0)
Eosinophils Absolute: 0.1 10*3/uL (ref 0.0–0.7)
Eosinophils Relative: 1.7 % (ref 0.0–5.0)
HEMATOCRIT: 39.1 % (ref 39.0–52.0)
HEMOGLOBIN: 12.9 g/dL — AB (ref 13.0–17.0)
LYMPHS ABS: 1.4 10*3/uL (ref 0.7–4.0)
Lymphocytes Relative: 30.4 % (ref 12.0–46.0)
MCHC: 32.9 g/dL (ref 30.0–36.0)
MCV: 81.2 fl (ref 78.0–100.0)
MONO ABS: 0.5 10*3/uL (ref 0.1–1.0)
Monocytes Relative: 10.9 % (ref 3.0–12.0)
NEUTROS ABS: 2.5 10*3/uL (ref 1.4–7.7)
Neutrophils Relative %: 55.8 % (ref 43.0–77.0)
PLATELETS: 288 10*3/uL (ref 150.0–400.0)
RBC: 4.82 Mil/uL (ref 4.22–5.81)
RDW: 15.4 % (ref 11.5–15.5)
WBC: 4.5 10*3/uL (ref 4.0–10.5)

## 2013-10-10 LAB — PROTIME-INR
INR: 0.9 ratio (ref 0.8–1.0)
Prothrombin Time: 10.2 s (ref 9.6–13.1)

## 2013-10-10 LAB — APTT: aPTT: 23.8 s (ref 23.4–32.7)

## 2013-10-17 ENCOUNTER — Ambulatory Visit (HOSPITAL_COMMUNITY)
Admission: RE | Admit: 2013-10-17 | Discharge: 2013-10-18 | Disposition: A | Discharge status: Home / Self Care | Payer: BC Managed Care – PPO | Source: Ambulatory Visit | Attending: Cardiovascular Disease | Admitting: Cardiovascular Disease

## 2013-10-17 ENCOUNTER — Encounter (HOSPITAL_COMMUNITY)
Admission: RE | Disposition: A | Discharge status: Home / Self Care | Payer: Self-pay | Source: Ambulatory Visit | Attending: Cardiovascular Disease

## 2013-10-17 DIAGNOSIS — Z86711 Personal history of pulmonary embolism: Secondary | ICD-10-CM | POA: Insufficient documentation

## 2013-10-17 DIAGNOSIS — Q2111 Secundum atrial septal defect: Secondary | ICD-10-CM | POA: Diagnosis not present

## 2013-10-17 DIAGNOSIS — I517 Cardiomegaly: Secondary | ICD-10-CM | POA: Diagnosis not present

## 2013-10-17 DIAGNOSIS — E119 Type 2 diabetes mellitus without complications: Secondary | ICD-10-CM | POA: Insufficient documentation

## 2013-10-17 DIAGNOSIS — Q211 Atrial septal defect: Secondary | ICD-10-CM | POA: Diagnosis present

## 2013-10-17 DIAGNOSIS — Z9049 Acquired absence of other specified parts of digestive tract: Secondary | ICD-10-CM | POA: Insufficient documentation

## 2013-10-17 HISTORY — PX: ASD REPAIR: SHX258

## 2013-10-17 LAB — POCT I-STAT 3, VENOUS BLOOD GAS (G3P V)
Acid-base deficit: 1 mmol/L (ref 0.0–2.0)
Bicarbonate: 25.4 mEq/L — ABNORMAL HIGH (ref 20.0–24.0)
Bicarbonate: 26.7 mEq/L — ABNORMAL HIGH (ref 20.0–24.0)
O2 Saturation: 81 %
O2 Saturation: 82 %
PH VEN: 7.318 — AB (ref 7.250–7.300)
PH VEN: 7.329 — AB (ref 7.250–7.300)
PO2 VEN: 50 mmHg — AB (ref 30.0–45.0)
TCO2: 27 mmol/L (ref 0–100)
TCO2: 28 mmol/L (ref 0–100)
pCO2, Ven: 49.6 mmHg (ref 45.0–50.0)
pCO2, Ven: 50.9 mmHg — ABNORMAL HIGH (ref 45.0–50.0)
pO2, Ven: 51 mmHg — ABNORMAL HIGH (ref 30.0–45.0)

## 2013-10-17 LAB — GLUCOSE, CAPILLARY
GLUCOSE-CAPILLARY: 93 mg/dL (ref 70–99)
Glucose-Capillary: 131 mg/dL — ABNORMAL HIGH (ref 70–99)
Glucose-Capillary: 140 mg/dL — ABNORMAL HIGH (ref 70–99)
Glucose-Capillary: 142 mg/dL — ABNORMAL HIGH (ref 70–99)

## 2013-10-17 LAB — POCT ACTIVATED CLOTTING TIME
Activated Clotting Time: 208 seconds
Activated Clotting Time: 197 seconds
Activated Clotting Time: 219 seconds

## 2013-10-17 SURGERY — REPAIR, ATRIAL SEPTAL DEFECT
Anesthesia: LOCAL

## 2013-10-17 MED ORDER — ACETAMINOPHEN 325 MG PO TABS: 650.0000 mg | ORAL_TABLET | ORAL | Status: DC | PRN

## 2013-10-17 MED ORDER — FENTANYL CITRATE 0.05 MG/ML IJ SOLN
INTRAMUSCULAR | Status: AC
  Filled 2013-10-17: qty 2

## 2013-10-17 MED ORDER — SODIUM CHLORIDE 0.9 % IV SOLN: INTRAVENOUS | Status: DC

## 2013-10-17 MED ORDER — SODIUM CHLORIDE 0.9 % IV SOLN: 1.0000 mL/kg/h | INTRAVENOUS | Status: DC

## 2013-10-17 MED ORDER — CEFAZOLIN SODIUM-DEXTROSE 2-3 GM-% IV SOLR
2.0000 g | Freq: Once | INTRAVENOUS | Status: AC
  Administered 2013-10-17: 19:00:00 2 g via INTRAVENOUS
  Filled 2013-10-17: qty 50

## 2013-10-17 MED ORDER — SODIUM CHLORIDE 0.9 % IJ SOLN: 3.0000 mL | INTRAMUSCULAR | Status: DC | PRN

## 2013-10-17 MED ORDER — CLOPIDOGREL BISULFATE 75 MG PO TABS
75.0000 mg | ORAL_TABLET | Freq: Every day | ORAL | Status: DC
  Administered 2013-10-17 – 2013-10-18 (×2): 75 mg via ORAL
  Filled 2013-10-17 (×2): qty 1

## 2013-10-17 MED ORDER — METFORMIN HCL 500 MG PO TABS
500.0000 mg | ORAL_TABLET | Freq: Two times a day (BID) | ORAL | Status: DC
  Filled 2013-10-17 (×4): qty 1

## 2013-10-17 MED ORDER — HEPARIN (PORCINE) IN NACL 2-0.9 UNIT/ML-% IJ SOLN
INTRAMUSCULAR | Status: AC
  Filled 2013-10-17: qty 1000

## 2013-10-17 MED ORDER — HEPARIN SODIUM (PORCINE) 1000 UNIT/ML IJ SOLN
INTRAMUSCULAR | Status: AC
  Filled 2013-10-17: qty 1

## 2013-10-17 MED ORDER — ASPIRIN 81 MG PO CHEW
81.0000 mg | CHEWABLE_TABLET | Freq: Every day | ORAL | Status: DC
  Administered 2013-10-17 – 2013-10-18 (×2): 81 mg via ORAL
  Filled 2013-10-17 (×2): qty 1

## 2013-10-17 MED ORDER — SODIUM CHLORIDE 0.9 % IJ SOLN: 3.0000 mL | Freq: Two times a day (BID) | INTRAMUSCULAR | Status: DC

## 2013-10-17 MED ORDER — SODIUM CHLORIDE 0.9 % IV SOLN
INTRAVENOUS | Status: DC
  Administered 2013-10-17: 09:00:00 via INTRAVENOUS

## 2013-10-17 MED ORDER — LIDOCAINE HCL (PF) 1 % IJ SOLN
INTRAMUSCULAR | Status: AC
  Filled 2013-10-17: qty 30

## 2013-10-17 MED ORDER — ESCITALOPRAM OXALATE 20 MG PO TABS
20.0000 mg | ORAL_TABLET | Freq: Every day | ORAL | Status: DC
  Administered 2013-10-17 – 2013-10-18 (×2): 20 mg via ORAL
  Filled 2013-10-17 (×2): qty 1

## 2013-10-17 MED ORDER — CEFAZOLIN SODIUM-DEXTROSE 2-3 GM-% IV SOLR
INTRAVENOUS | Status: AC
  Filled 2013-10-17: qty 50

## 2013-10-17 MED ORDER — ONDANSETRON HCL 4 MG/2ML IJ SOLN: 4.0000 mg | Freq: Four times a day (QID) | INTRAMUSCULAR | Status: DC | PRN

## 2013-10-17 MED ORDER — MIDAZOLAM HCL 2 MG/2ML IJ SOLN
INTRAMUSCULAR | Status: AC
  Filled 2013-10-17: qty 2

## 2013-10-17 MED ORDER — SODIUM CHLORIDE 0.9 % IV SOLN: 250.0000 mL | INTRAVENOUS | Status: DC | PRN

## 2013-10-17 NOTE — Progress Notes (Addendum)
Site area: right groijn Site Prior to Removal:  Level 0 Manual:   Yes x 30 minutes Patient Status During Pull:  stable Post Pull Site:  Level 0 Post Pull Instructions Given:  yes Post Pull Pulses Present: yes, remained palpable during sheath pull Dressing Applied:  tegaderm Bedrest begins @ 13:00:00 Comments: No complications
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
## 2013-10-17 NOTE — Care Management Note (Addendum)
  Page 1 of 1   10/17/2013     2:05:39 PM CARE MANAGEMENT NOTE 10/17/2013  Patient:  Ray Clark, Ray Clark   Account Number:  [id number]  Date Initiated:  10/17/2013  Documentation initiated by:  Donato Schultz  Subjective/Objective Assessment:   ASD     Action/Plan:   CM to follow for disposition needs   Anticipated DC Date:  10/18/2013   Anticipated DC Plan:  HOME/SELF CARE         Choice offered to / List presented to:             Status of service:  Completed, signed off Medicare Important Message given?   (If response is "NO", the following Medicare IM given date fields will be blank) Date Medicare IM given:   Medicare IM given by:   Date Additional Medicare IM given:   Additional Medicare IM given by:    Discharge Disposition:  HOME/SELF CARE  Per UR Regulation:    If discussed at Long Length of Stay Meetings, dates discussed:    Comments:  Leslieann Whisman RN, BSN, MSHL, CCM  Nurse - Case Manager,  (Unit [phone]  10/17/2013 Medication Review:  Plavix Disposition Plan:  Home / Self care.

## 2013-10-17 NOTE — Progress Notes (Addendum)
6C unable to take patient at this time. Patient in Cath Lab holding. Patient arrived with 69fr and 8 fr venous sheaths sutured in right groin with normal saline at [facility]. Groin level 0. Patient resting with eyes closed.

## 2013-10-17 NOTE — CV Procedure (Addendum)
   Cardiac Catheterization Procedure Note  Name: Ray Clark MRN: [id number] DOB: 1965/04/14  Procedure: Right heart catheterization, intracardiac echocardiography, transcatheter ASD closure with a 14 mm Amplatzer septal occluder device  Indication: Ostium secundum ASD with right heart enlargement   Procedural details: The right groin was prepped, draped, and anesthetized with 1% lidocaine. Using modified Seldinger technique, a 9 French sheath was placed in the right femoral vein. A second access site was obtained in the right femoral vein and an 8 French sheath was placed. A multipurpose catheter was then used for a right heart catheterization. Oxygen saturations were recorded in the superior vena cava and pulmonary artery. The intracardiac echo probe was then passed and an extensive imaging study was performed from both the right atrium and right ventricle. All valves were interrogated. The atrial septum was carefully interrogated. A multipurpose catheter was then used to cross the interatrial septum and direct an Amplatz stiff wire into the left upper pulmonary vein. Balloon sizing was then performed with a 24 mm balloon using a 4-1 saline contrast mix.  We used both echo and fluoroscopic guidance to measure the defect at "stopped flow." The defect measured 13 mm by echo and 14 mm by fluoroscopy. We elected to use a 14 mm Amplatzer septal occluder device. This was prepped under normal conditions and brought into the delivery device. The Torquevue sheath was then shaped using a heat gun so that a double curve was placed on the end of the sheath. The 14 mm septal occluder device was deployed after introduction of the sheath into the left atrium. The deployment was performed under intracardiac echo and fluoroscopic guidance. The device deployed appropriately so that the primum and secundum septum were both captured. Device position was confirmed both fluoroscopically and with echocardiography. The  device was released and final imaging demonstrated no residual  Left to right shunt. The patient tolerated the procedure well. The 8 French delivery sheath was changed out for a short 8 [place] sheath. There were no immediate procedural complications. The patient was transferred to the post catheterization recovery area for further monitoring.  Procedural Findings: Intracardiac echo: The tricuspid, mitral, and aortic valves appear to function normally. The aortic valve is bicuspid without significant aortic stenosis or insufficiency. There is a secundum type ASD with continuous left to right flow measuring approximately 10-12 mm on static imaging. All 4 pulmonary veins were identified. LV function appears normal.   Hemodynamics: Right atrium 6 Right ventricle 25/7 Pulmonary artery 26/11 with a mean of 18 Pulmonary capillary which pressure mean 14  SVC oxygen saturation 81% Pulmonary artery oxygen saturation 82% Aortic oxygen saturation 100%  Final Conclusions:  Successful transcatheter closure of an ostium secundum ASD with a 14 mm Amplatzer septal occluder device  Recommendations: Overnight observation, 2-D echocardiogram and chest x-ray in the morning, anticipate discharge home tomorrow.  Tonny Bollman MD 10/17/2013, 11:33 AM

## 2013-10-18 ENCOUNTER — Ambulatory Visit (HOSPITAL_COMMUNITY): Payer: BC Managed Care – PPO

## 2013-10-18 ENCOUNTER — Other Ambulatory Visit: Payer: Self-pay | Admitting: Physician Assistant

## 2013-10-18 DIAGNOSIS — Q2111 Secundum atrial septal defect: Secondary | ICD-10-CM | POA: Diagnosis not present

## 2013-10-18 DIAGNOSIS — I519 Heart disease, unspecified: Secondary | ICD-10-CM

## 2013-10-18 DIAGNOSIS — Q211 Atrial septal defect, unspecified: Secondary | ICD-10-CM

## 2013-10-18 DIAGNOSIS — Z86711 Personal history of pulmonary embolism: Secondary | ICD-10-CM | POA: Diagnosis not present

## 2013-10-18 DIAGNOSIS — E119 Type 2 diabetes mellitus without complications: Secondary | ICD-10-CM | POA: Diagnosis not present

## 2013-10-18 DIAGNOSIS — Z9049 Acquired absence of other specified parts of digestive tract: Secondary | ICD-10-CM | POA: Diagnosis not present

## 2013-10-18 LAB — GLUCOSE, CAPILLARY
GLUCOSE-CAPILLARY: 124 mg/dL — AB (ref 70–99)
Glucose-Capillary: 212 mg/dL — ABNORMAL HIGH (ref 70–99)

## 2013-10-18 LAB — POCT ACTIVATED CLOTTING TIME: ACTIVATED CLOTTING TIME: 168 s

## 2013-10-18 MED ORDER — ASPIRIN 81 MG PO CHEW: 81.0000 mg | CHEWABLE_TABLET | Freq: Every day | ORAL | Status: DC

## 2013-10-18 MED ORDER — LIVING WELL WITH DIABETES BOOK
Freq: Once | Status: AC
  Administered 2013-10-18: 07:00:00
  Filled 2013-10-18: qty 1

## 2013-10-18 MED ORDER — METFORMIN HCL 500 MG PO TABS: 500.0000 mg | ORAL_TABLET | Freq: Two times a day (BID) | ORAL | Status: AC

## 2013-10-18 NOTE — Progress Notes (Signed)
Patient ID: Ray Clark, male   DOB: 08/13/1965, 48 y.o.   MRN: [id number] Social visit Violeta Gelinas, MD, MPH, FACS Trauma: [phone] General Surgery: [phone]

## 2013-10-18 NOTE — Progress Notes (Signed)
*  PRELIMINARY RESULTS* Echocardiogram 2D Echocardiogram has been performed.  Jeryl Columbia 10/18/2013, 10:05 AM
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
## 2013-10-18 NOTE — Progress Notes (Signed)
Appointments scheduled on 11/21/13.

## 2013-10-18 NOTE — Progress Notes (Signed)
    Subjective:  No CP or dyspnea.  Objective:  Vital Signs in the last 24 hours: Temp:  [97.6 F (36.4 C)-98.5 F (36.9 C)] 98.5 F (36.9 C) (09/01 0759) Pulse Rate:  [62-89] 79 (09/01 0759) Resp:  [8-20] 18 (09/01 0759) BP: (104-125)/(69-95) 106/80 mmHg (09/01 0759) SpO2:  [97 %-100 %] 99 % (09/01 0759) Weight:  [140 lb (63.504 kg)-141 lb 1.5 oz (64 kg)] 141 lb 1.5 oz (64 kg) (09/01 0121)  Intake/Output from previous day: 08/31 0701 - 09/01 0700 In: 1200 [P.O.:600; I.V.:600] Out: 900 [Urine:900]  Physical Exam: Pt is alert and oriented, NAD HEENT: normal Neck: JVP - normal Lungs: CTA bilaterally CV: RRR without murmur or gallop Abd: soft, NT, Positive BS, no hepatomegaly Ext: no C/C/E, distal pulses intact and equal, right groin site clear Skin: warm/dry no rash   Lab Results: No results found for this basename: WBC, HGB, PLT,  in the last 72 hours No results found for this basename: NA, K, CL, CO2, GLUCOSE, BUN, CREATININE,  in the last 72 hours No results found for this basename: TROPONINI, CK, MB,  in the last 72 hours  Cardiac Studies: CXR: IMPRESSION:  Status post atrial septal closure without evidence of complication.  Trace bilateral pleural effusions. Lungs are clear.  Tele: Sinus rhythm, personally reviewed.  Assessment/Plan:  1. Secundum ASD s/p transcatheter closure 2. Type 2 DM - continued on Metformin (no contrast given during procedure)  Pt stable. Looks ready for discharge. Recommend:  Await 2D Echo, then d/c home  ASA 81 mg and plavix 75 mg x 6 months  SBE prophylaxis x 6 months  RTN work 1 week  Follow-up 1 month with an echo and same-day office visit.  Tonny Bollman, M.D. 10/18/2013, 8:20 AM

## 2013-11-07 ENCOUNTER — Other Ambulatory Visit: Payer: Self-pay | Admitting: *Deleted

## 2013-11-07 MED ORDER — CLOPIDOGREL BISULFATE 75 MG PO TABS: 75.0000 mg | ORAL_TABLET | Freq: Every day | ORAL | Status: DC

## 2013-11-21 ENCOUNTER — Encounter: Payer: Self-pay | Admitting: Cardiovascular Disease

## 2013-11-21 ENCOUNTER — Ambulatory Visit (INDEPENDENT_AMBULATORY_CARE_PROVIDER_SITE_OTHER): Payer: BC Managed Care – PPO | Admitting: Cardiovascular Disease

## 2013-11-21 ENCOUNTER — Ambulatory Visit (HOSPITAL_COMMUNITY): Payer: BC Managed Care – PPO | Attending: Internal Medicine | Admitting: Cardiology

## 2013-11-21 VITALS — BP 100/76 | HR 87 | Ht 66.0 in | Wt 143.0 lb

## 2013-11-21 DIAGNOSIS — Q2111 Secundum atrial septal defect: Secondary | ICD-10-CM

## 2013-11-21 DIAGNOSIS — Q211 Atrial septal defect, unspecified: Secondary | ICD-10-CM

## 2013-11-21 DIAGNOSIS — Z9889 Other specified postprocedural states: Secondary | ICD-10-CM | POA: Insufficient documentation

## 2013-11-21 MED ORDER — ASPIRIN EC 81 MG PO TBEC: 81.0000 mg | DELAYED_RELEASE_TABLET | Freq: Every day | ORAL | Status: DC
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
## 2013-11-21 NOTE — Progress Notes (Signed)
Echo performed. 

## 2013-11-21 NOTE — Patient Instructions (Addendum)
Your physician wants you to follow-up in: 6 MONTHS with Dr Excell Seltzerooper.  You will receive a reminder letter in the mail two months in advance. If you don't receive a letter, please call our office to schedule the follow-up appointment.  Your physician has requested that you have a limited echocardiogram with bubble study in 6 MONTHS. Echocardiography is a painless test that uses sound waves to create images of your heart. It provides your doctor with information about the size and shape of your heart and how well your heart's chambers and valves are working. This procedure takes approximately one hour. There are no restrictions for this procedure.  Your physician recommends that you continue on your current medications as directed. Please refer to the Current Medication list given to you today. Restart Aspirin 81mg  once a day.

## 2013-11-30 ENCOUNTER — Encounter (INDEPENDENT_AMBULATORY_CARE_PROVIDER_SITE_OTHER): Payer: BC Managed Care – PPO | Admitting: General Surgery

## 2013-12-28 ENCOUNTER — Other Ambulatory Visit (INDEPENDENT_AMBULATORY_CARE_PROVIDER_SITE_OTHER): Payer: Self-pay

## 2013-12-28 ENCOUNTER — Ambulatory Visit (INDEPENDENT_AMBULATORY_CARE_PROVIDER_SITE_OTHER): Payer: Self-pay | Admitting: General Surgery

## 2013-12-28 ENCOUNTER — Telehealth (INDEPENDENT_AMBULATORY_CARE_PROVIDER_SITE_OTHER): Payer: Self-pay

## 2013-12-28 DIAGNOSIS — K439 Ventral hernia without obstruction or gangrene: Secondary | ICD-10-CM

## 2013-12-28 NOTE — Telephone Encounter (Signed)
Per Dr Carollee Massedhompson's order CT abd and pelvis order in epic and encounter to ref coord to set up and call pt.

## 2013-12-30 ENCOUNTER — Telehealth (INDEPENDENT_AMBULATORY_CARE_PROVIDER_SITE_OTHER): Payer: Self-pay

## 2013-12-30 NOTE — Telephone Encounter (Signed)
-----   Message from Violeta GelinasBurke Thompson, MD sent at 12/29/2013 11:08 AM EST ----- Please call Ray Clark Monday and tell him to stop his Plavix 5d before surgery.Thx BT

## 2013-12-30 NOTE — Telephone Encounter (Signed)
I spoke with pts wife. She states we can give her any msg and she will advised her husband. She is advised that pt needs to stop plavix 5-days prior to surgery. Whe states she understands and will advise her husband of this.

## 2014-01-02 ENCOUNTER — Telehealth (INDEPENDENT_AMBULATORY_CARE_PROVIDER_SITE_OTHER): Payer: Self-pay

## 2014-01-02 ENCOUNTER — Ambulatory Visit
Admission: RE | Admit: 2014-01-02 | Discharge: 2014-01-02 | Disposition: A | Discharge status: Home / Self Care | Payer: BC Managed Care – PPO | Source: Ambulatory Visit | Attending: General Surgery | Admitting: General Surgery

## 2014-01-02 DIAGNOSIS — K439 Ventral hernia without obstruction or gangrene: Secondary | ICD-10-CM

## 2014-01-02 MED ORDER — IOHEXOL 300 MG/ML  SOLN
100.0000 mL | Freq: Once | INTRAMUSCULAR | Status: AC | PRN
  Administered 2014-01-02: 100 mL via INTRAVENOUS

## 2014-01-02 NOTE — Telephone Encounter (Signed)
Ct report in epic and msg sent to Dr Janee Mornhompson to review and advise follow up.

## 2014-01-04 ENCOUNTER — Encounter (HOSPITAL_COMMUNITY): Payer: Self-pay

## 2014-01-04 ENCOUNTER — Encounter (HOSPITAL_COMMUNITY)
Admission: RE | Admit: 2014-01-04 | Discharge: 2014-01-04 | Disposition: A | Discharge status: Home / Self Care | Payer: BC Managed Care – PPO | Source: Ambulatory Visit | Attending: General Surgery | Admitting: General Surgery

## 2014-01-04 DIAGNOSIS — Z87828 Personal history of other (healed) physical injury and trauma: Secondary | ICD-10-CM | POA: Insufficient documentation

## 2014-01-04 DIAGNOSIS — E119 Type 2 diabetes mellitus without complications: Secondary | ICD-10-CM | POA: Insufficient documentation

## 2014-01-04 DIAGNOSIS — Z01818 Encounter for other preprocedural examination: Secondary | ICD-10-CM | POA: Insufficient documentation

## 2014-01-04 DIAGNOSIS — Q211 Atrial septal defect: Secondary | ICD-10-CM | POA: Insufficient documentation

## 2014-01-04 DIAGNOSIS — Z86711 Personal history of pulmonary embolism: Secondary | ICD-10-CM | POA: Insufficient documentation

## 2014-01-04 DIAGNOSIS — I340 Nonrheumatic mitral (valve) insufficiency: Secondary | ICD-10-CM | POA: Insufficient documentation

## 2014-01-04 DIAGNOSIS — K432 Incisional hernia without obstruction or gangrene: Secondary | ICD-10-CM | POA: Diagnosis not present

## 2014-01-04 NOTE — Pre-Procedure Instructions (Signed)
Ray NorfolkKashyap M Clark  01/04/2014   Your procedure is scheduled on:  01/16/14  [facility] at 845 AM.  Call this number if you have problems the morning of surgery: [phone]   Remember:   Do not eat food or drink liquids after midnight.   Take these medicines the morning of surgery with A SIP OF WATER: lexapro     Take all meds as ordered until day of surgery except as instructed below or per dr  Despina AriasSTOP all herbel meds, nsaids (aleve,naproxen,advil,ibuprofen) 5 days prior to surgery(01/11/14) including vitamins    NO diabetic med day of surgery  D/C plavix per dr (01/11/14)5 days before surgery   Do not wear jewelry, make-up or nail polish.  Do not wear lotions, powders, or perfumes. You may wear deodorant.  Do not shave 48 hours prior to surgery. Men may shave face and neck.  Do not bring valuables to the hospital.  [facility] is not responsible                  for any belongings or valuables.               Contacts, dentures or bridgework may not be worn into surgery.  Leave suitcase in the car. After surgery it may be brought to your room.  For patients admitted to the hospital, discharge time is determined by your                treatment team.               Patients discharged the day of surgery will not be allowed to drive  home.  Name and phone number of your driver:   Special Instructions:  Special Instructions:  - Preparing for Surgery  Before surgery, you can play an important role.  Because skin is not sterile, your skin needs to be as free of germs as possible.  You can reduce the number of germs on you skin by washing with CHG (chlorahexidine gluconate) soap before surgery.  CHG is an antiseptic cleaner which kills germs and bonds with the skin to continue killing germs even after washing.  Please DO NOT use if you have an allergy to CHG or antibacterial soaps.  If your skin becomes reddened/irritated stop using the CHG and  inform your nurse when you arrive at Short Stay.  Do not shave (including legs and underarms) for at least 48 hours prior to the first CHG shower.  You may shave your face.  Please follow these instructions carefully:   1.  Shower with CHG Soap the night before surgery and the morning of Surgery.  2.  If you choose to wash your hair, wash your hair first as usual with your normal shampoo.  3.  After you shampoo, rinse your hair and body thoroughly to remove the Shampoo.  4.  Use CHG as you would any other liquid soap.  You can apply chg directly  to the skin and wash gently with scrungie or a clean washcloth.  5.  Apply the CHG Soap to your body ONLY FROM THE NECK DOWN.  Do not use on open wounds or open sores.  Avoid contact with your eyes ears, mouth and genitals (private parts).  Wash genitals (private parts)       with your normal soap.  6.  Wash thoroughly, paying special attention to the area where your surgery will be performed.  7.  Thoroughly rinse your body with warm water from the neck down.  8.  DO NOT shower/wash with your normal soap after using and rinsing off the CHG Soap.  9.  Pat yourself dry with a clean towel.            10.  Wear clean pajamas.            11.  Place clean sheets on your bed the night of your first shower and do not sleep with pets.  Day of Surgery  Do not apply any lotions/deodorants the morning of surgery.  Please wear clean clothes to the hospital/surgery center.   Please read over the following fact sheets that you were given: Pain Booklet, Coughing and Deep Breathing and Surgical Site Infection Prevention

## 2014-01-04 NOTE — Progress Notes (Signed)
Patient arrived to PAT and informed Nurse that he had another scheduled appointment with PCP directly after this visit. Patient informed Nurse that he had lab work drawn at PCP office Dr. Ludwig ClarksMoreira. Nurse called office [phone]  and spoke with staff and they informed Nurse that patient had CBC, CMP, and lipid panel drawn today. Will request lab work from PCP.   Patient denied having any chest or pulmonary issues. Patient informed Nurse that he had a stress test with Dr. Jacinto HalimGanji in the last 5 years and he had a cardiac cath with Cardiologist Dr. Tonny BollmanMichael Cooper on 10/17/13; encounter in EPIC.
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
## 2014-01-04 NOTE — Pre-Procedure Instructions (Signed)
Ray NorfolkKashyap M Clark  01/04/2014   Your procedure is scheduled on:  Monday January 16, 2014 at 10:45 AM.  [facility].  Call this number if you have problems the morning of surgery: [phone]   Call this number if you have any questions prior to your surgery: [phone]   Remember:   Do not eat food or drink liquids after midnight.   Take these medicines the morning of surgery with A SIP OF WATER: Lexapro     STOP all herbel meds, nsaids (aleve,naproxen,advil,ibuprofen) 5 days prior to surgery(01/11/14) including vitamins     Do NOT take any diabetic medications the morning of your surgery   Stop Plavix per dr (01/11/14) 5 days before surgery   Do not wear jewelry.  Do not wear lotions, powders, or cologne.   Men may shave face and neck.  Do not bring valuables to the hospital.  [facility] is not responsible for any belongings or valuables.               Contacts, dentures or bridgework may not be worn into surgery.  Leave suitcase in the car. After surgery it may be brought to your room.  For patients admitted to the hospital, discharge time is determined by your treatment team.               Patients discharged the day of surgery will not be allowed to drive home.  Name and phone number of your driver:  Special Instructions:  Shower using CHG soap the night before the surgery and the morning of your surgery   Please read over the following fact sheets that you were given: Pain Booklet, Coughing and Deep Breathing and Surgical Site Infection Prevention

## 2014-01-15 MED ORDER — CEFAZOLIN SODIUM-DEXTROSE 2-3 GM-% IV SOLR
2.0000 g | INTRAVENOUS | Status: AC
  Administered 2014-01-16: 2 g via INTRAVENOUS
  Filled 2014-01-15: qty 50

## 2014-01-16 ENCOUNTER — Ambulatory Visit (HOSPITAL_COMMUNITY): Payer: BC Managed Care – PPO | Admitting: Emergency Medicine

## 2014-01-16 ENCOUNTER — Observation Stay (HOSPITAL_COMMUNITY)
Admission: RE | Admit: 2014-01-16 | Discharge: 2014-01-17 | Disposition: A | Discharge status: Home / Self Care | Payer: BC Managed Care – PPO | Source: Ambulatory Visit | Attending: General Surgery | Admitting: General Surgery

## 2014-01-16 ENCOUNTER — Ambulatory Visit (HOSPITAL_COMMUNITY): Payer: BC Managed Care – PPO | Admitting: Certified Registered"

## 2014-01-16 ENCOUNTER — Encounter (HOSPITAL_COMMUNITY)
Admission: RE | Disposition: A | Discharge status: Home / Self Care | Payer: Self-pay | Source: Ambulatory Visit | Attending: General Surgery

## 2014-01-16 ENCOUNTER — Encounter (HOSPITAL_COMMUNITY): Payer: Self-pay | Admitting: Anesthesiology

## 2014-01-16 DIAGNOSIS — Z7982 Long term (current) use of aspirin: Secondary | ICD-10-CM | POA: Diagnosis not present

## 2014-01-16 DIAGNOSIS — E119 Type 2 diabetes mellitus without complications: Secondary | ICD-10-CM | POA: Diagnosis not present

## 2014-01-16 DIAGNOSIS — K432 Incisional hernia without obstruction or gangrene: Principal | ICD-10-CM | POA: Insufficient documentation

## 2014-01-16 DIAGNOSIS — Z9889 Other specified postprocedural states: Secondary | ICD-10-CM

## 2014-01-16 DIAGNOSIS — Z8719 Personal history of other diseases of the digestive system: Secondary | ICD-10-CM

## 2014-01-16 HISTORY — PX: HERNIA REPAIR: SHX51

## 2014-01-16 HISTORY — PX: INSERTION OF MESH: SHX5868

## 2014-01-16 HISTORY — PX: INCISIONAL HERNIA REPAIR: SHX193

## 2014-01-16 LAB — CBC
HCT: 35.9 % — ABNORMAL LOW (ref 39.0–52.0)
Hemoglobin: 11.4 g/dL — ABNORMAL LOW (ref 13.0–17.0)
MCH: 25.9 pg — ABNORMAL LOW (ref 26.0–34.0)
MCHC: 31.8 g/dL (ref 30.0–36.0)
MCV: 81.4 fL (ref 78.0–100.0)
PLATELETS: 208 10*3/uL (ref 150–400)
RBC: 4.41 MIL/uL (ref 4.22–5.81)
RDW: 14.3 % (ref 11.5–15.5)
WBC: 4.4 10*3/uL (ref 4.0–10.5)

## 2014-01-16 LAB — GLUCOSE, CAPILLARY
Glucose-Capillary: 147 mg/dL — ABNORMAL HIGH (ref 70–99)
Glucose-Capillary: 125 mg/dL — ABNORMAL HIGH (ref 70–99)
Glucose-Capillary: 121 mg/dL — ABNORMAL HIGH (ref 70–99)

## 2014-01-16 LAB — CREATININE, SERUM
Creatinine, Ser: 0.75 mg/dL (ref 0.50–1.35)
GFR calc Af Amer: >90 mL/min (ref >90)
GFR calc non Af Amer: >90 mL/min (ref >90)

## 2014-01-16 SURGERY — REPAIR, HERNIA, INCISIONAL
Anesthesia: General | Site: Abdomen | Laterality: Right

## 2014-01-16 MED ORDER — OXYCODONE HCL 5 MG/5ML PO SOLN: 5.0000 mg | Freq: Once | ORAL | Status: DC | PRN

## 2014-01-16 MED ORDER — INSULIN ASPART 100 UNIT/ML ~~LOC~~ SOLN: 0.0000 [IU] | Freq: Every day | SUBCUTANEOUS | Status: DC

## 2014-01-16 MED ORDER — OXYCODONE HCL 5 MG PO TABS: 5.0000 mg | ORAL_TABLET | Freq: Once | ORAL | Status: DC | PRN

## 2014-01-16 MED ORDER — FENTANYL CITRATE 0.05 MG/ML IJ SOLN
INTRAMUSCULAR | Status: AC
  Filled 2014-01-16: qty 5

## 2014-01-16 MED ORDER — ENOXAPARIN SODIUM 40 MG/0.4ML ~~LOC~~ SOLN
40.0000 mg | SUBCUTANEOUS | Status: DC
  Administered 2014-01-17: 40 mg via SUBCUTANEOUS
  Filled 2014-01-16: qty 0.4

## 2014-01-16 MED ORDER — LIDOCAINE HCL (CARDIAC) 20 MG/ML IV SOLN
INTRAVENOUS | Status: DC | PRN
  Administered 2014-01-16: 100 mg via INTRAVENOUS

## 2014-01-16 MED ORDER — BUPIVACAINE-EPINEPHRINE (PF) 0.25% -1:200000 IJ SOLN
INTRAMUSCULAR | Status: AC
  Filled 2014-01-16: qty 30

## 2014-01-16 MED ORDER — ROCURONIUM BROMIDE 50 MG/5ML IV SOLN
INTRAVENOUS | Status: AC
  Filled 2014-01-16: qty 1

## 2014-01-16 MED ORDER — ONDANSETRON HCL 4 MG/2ML IJ SOLN
4.0000 mg | Freq: Once | INTRAMUSCULAR | Status: DC | PRN
Start: 2014-01-16 — End: 2014-01-16

## 2014-01-16 MED ORDER — HYDROMORPHONE HCL 1 MG/ML IJ SOLN
0.2500 mg | INTRAMUSCULAR | Status: DC | PRN
  Administered 2014-01-16: 0.5 mg via INTRAVENOUS

## 2014-01-16 MED ORDER — GLYCOPYRROLATE 0.2 MG/ML IJ SOLN
INTRAMUSCULAR | Status: DC | PRN
  Administered 2014-01-16: 0.6 mg via INTRAVENOUS

## 2014-01-16 MED ORDER — BUPIVACAINE-EPINEPHRINE 0.25% -1:200000 IJ SOLN
INTRAMUSCULAR | Status: DC | PRN
  Administered 2014-01-16: 30 mL

## 2014-01-16 MED ORDER — MIDAZOLAM HCL 5 MG/5ML IJ SOLN
INTRAMUSCULAR | Status: DC | PRN
  Administered 2014-01-16 (×2): 1 mg via INTRAVENOUS

## 2014-01-16 MED ORDER — LIDOCAINE HCL (CARDIAC) 20 MG/ML IV SOLN
INTRAVENOUS | Status: AC
  Filled 2014-01-16: qty 5

## 2014-01-16 MED ORDER — ONDANSETRON HCL 4 MG/2ML IJ SOLN
INTRAMUSCULAR | Status: AC
  Filled 2014-01-16: qty 2

## 2014-01-16 MED ORDER — LACTATED RINGERS IV SOLN
INTRAVENOUS | Status: DC | PRN
Start: 2014-01-16 — End: 2014-01-16
  Administered 2014-01-16 (×2): via INTRAVENOUS

## 2014-01-16 MED ORDER — INSULIN ASPART 100 UNIT/ML ~~LOC~~ SOLN
0.0000 [IU] | Freq: Three times a day (TID) | SUBCUTANEOUS | Status: DC
  Administered 2014-01-16 – 2014-01-17 (×2): 1 [IU] via SUBCUTANEOUS
  Administered 2014-01-17 (×2): 2 [IU] via SUBCUTANEOUS

## 2014-01-16 MED ORDER — ESCITALOPRAM OXALATE 20 MG PO TABS
20.0000 mg | ORAL_TABLET | Freq: Every day | ORAL | Status: DC
  Administered 2014-01-16 – 2014-01-17 (×2): 20 mg via ORAL
  Filled 2014-01-16 (×2): qty 1

## 2014-01-16 MED ORDER — ONDANSETRON HCL 4 MG/2ML IJ SOLN: 4.0000 mg | Freq: Four times a day (QID) | INTRAMUSCULAR | Status: DC | PRN

## 2014-01-16 MED ORDER — TRAMADOL HCL 50 MG PO TABS
50.0000 mg | ORAL_TABLET | Freq: Four times a day (QID) | ORAL | Status: DC | PRN
  Filled 2014-01-16: qty 1

## 2014-01-16 MED ORDER — PROPOFOL 10 MG/ML IV BOLUS
INTRAVENOUS | Status: DC | PRN
  Administered 2014-01-16: 120 mg via INTRAVENOUS

## 2014-01-16 MED ORDER — CHLORHEXIDINE GLUCONATE 4 % EX LIQD: 1.0000 "application " | Freq: Once | CUTANEOUS | Status: DC

## 2014-01-16 MED ORDER — ACETAMINOPHEN 325 MG PO TABS
650.0000 mg | ORAL_TABLET | Freq: Four times a day (QID) | ORAL | Status: DC | PRN
Start: 2014-01-16 — End: 2014-01-17

## 2014-01-16 MED ORDER — PHENYLEPHRINE HCL 10 MG/ML IJ SOLN
INTRAMUSCULAR | Status: DC | PRN
  Administered 2014-01-16 (×3): 80 ug via INTRAVENOUS

## 2014-01-16 MED ORDER — HYDROMORPHONE HCL 1 MG/ML IJ SOLN
1.0000 mg | INTRAMUSCULAR | Status: DC | PRN
  Administered 2014-01-16: 1 mg via INTRAVENOUS
  Filled 2014-01-16: qty 1

## 2014-01-16 MED ORDER — EPHEDRINE SULFATE 50 MG/ML IJ SOLN
INTRAMUSCULAR | Status: DC | PRN
  Administered 2014-01-16: 10 mg via INTRAVENOUS

## 2014-01-16 MED ORDER — PROPOFOL 10 MG/ML IV BOLUS
INTRAVENOUS | Status: AC
  Filled 2014-01-16: qty 20

## 2014-01-16 MED ORDER — HYDROMORPHONE HCL 1 MG/ML IJ SOLN
INTRAMUSCULAR | Status: AC
  Filled 2014-01-16: qty 1

## 2014-01-16 MED ORDER — ONDANSETRON HCL 4 MG PO TABS: 4.0000 mg | ORAL_TABLET | Freq: Four times a day (QID) | ORAL | Status: DC | PRN

## 2014-01-16 MED ORDER — MIDAZOLAM HCL 2 MG/2ML IJ SOLN
INTRAMUSCULAR | Status: AC
  Filled 2014-01-16: qty 2

## 2014-01-16 MED ORDER — ROCURONIUM BROMIDE 100 MG/10ML IV SOLN
INTRAVENOUS | Status: DC | PRN
  Administered 2014-01-16: 50 mg via INTRAVENOUS

## 2014-01-16 MED ORDER — KCL IN DEXTROSE-NACL 20-5-0.45 MEQ/L-%-% IV SOLN
INTRAVENOUS | Status: DC
  Administered 2014-01-16 – 2014-01-17 (×2): via INTRAVENOUS
  Filled 2014-01-16 (×3): qty 1000

## 2014-01-16 MED ORDER — MEPERIDINE HCL 25 MG/ML IJ SOLN: 6.2500 mg | INTRAMUSCULAR | Status: DC | PRN

## 2014-01-16 MED ORDER — ONDANSETRON HCL 4 MG/2ML IJ SOLN
INTRAMUSCULAR | Status: DC | PRN
  Administered 2014-01-16: 4 mg via INTRAVENOUS

## 2014-01-16 MED ORDER — LACTATED RINGERS IV SOLN
INTRAVENOUS | Status: DC
  Administered 2014-01-16: 09:00:00 via INTRAVENOUS

## 2014-01-16 MED ORDER — NEOSTIGMINE METHYLSULFATE 10 MG/10ML IV SOLN
INTRAVENOUS | Status: DC | PRN
  Administered 2014-01-16: 4 mg via INTRAVENOUS

## 2014-01-16 MED ORDER — FENTANYL CITRATE 0.05 MG/ML IJ SOLN
INTRAMUSCULAR | Status: DC | PRN
  Administered 2014-01-16: 50 ug via INTRAVENOUS
  Administered 2014-01-16: 100 ug via INTRAVENOUS
  Administered 2014-01-16: 50 ug via INTRAVENOUS

## 2014-01-16 MED ORDER — OXYCODONE-ACETAMINOPHEN 5-325 MG PO TABS
1.0000 | ORAL_TABLET | ORAL | Status: DC | PRN
  Administered 2014-01-17 (×3): 2 via ORAL
  Filled 2014-01-16 (×4): qty 2

## 2014-01-16 MED ORDER — 0.9 % SODIUM CHLORIDE (POUR BTL) OPTIME
TOPICAL | Status: DC | PRN
  Administered 2014-01-16: 1000 mL

## 2014-01-16 SURGICAL SUPPLY — 43 items
BLADE SURG ROTATE 9660 (MISCELLANEOUS) ×3 IMPLANT
CANISTER SUCTION 2500CC (MISCELLANEOUS) ×3 IMPLANT
CHLORAPREP W/TINT 26ML (MISCELLANEOUS) ×3 IMPLANT
COVER SURGICAL LIGHT HANDLE (MISCELLANEOUS) ×3 IMPLANT
DERMABOND ADVANCED (GAUZE/BANDAGES/DRESSINGS) ×2
DERMABOND ADVANCED .7 DNX12 (GAUZE/BANDAGES/DRESSINGS) ×1 IMPLANT
DRAPE LAPAROSCOPIC ABDOMINAL (DRAPES) ×3 IMPLANT
DRAPE UTILITY XL STRL (DRAPES) ×6 IMPLANT
ELECT CAUTERY BLADE 6.4 (BLADE) ×3 IMPLANT
ELECT REM PT RETURN 9FT ADLT (ELECTROSURGICAL) ×3
ELECTRODE REM PT RTRN 9FT ADLT (ELECTROSURGICAL) ×1 IMPLANT
GAUZE SPONGE 4X4 12PLY STRL (GAUZE/BANDAGES/DRESSINGS) IMPLANT
GLOVE BIO SURGEON STRL SZ8 (GLOVE) ×3 IMPLANT
GLOVE BIOGEL PI IND STRL 7.0 (GLOVE) ×2 IMPLANT
GLOVE BIOGEL PI IND STRL 7.5 (GLOVE) ×1 IMPLANT
GLOVE BIOGEL PI IND STRL 8 (GLOVE) ×1 IMPLANT
GLOVE BIOGEL PI INDICATOR 7.0 (GLOVE) ×4
GLOVE BIOGEL PI INDICATOR 7.5 (GLOVE) ×2
GLOVE BIOGEL PI INDICATOR 8 (GLOVE) ×2
GLOVE SURG SS PI 7.0 STRL IVOR (GLOVE) ×9 IMPLANT
GOWN STRL REUS W/ TWL LRG LVL3 (GOWN DISPOSABLE) ×2 IMPLANT
GOWN STRL REUS W/ TWL XL LVL3 (GOWN DISPOSABLE) ×1 IMPLANT
GOWN STRL REUS W/TWL LRG LVL3 (GOWN DISPOSABLE) ×4
GOWN STRL REUS W/TWL XL LVL3 (GOWN DISPOSABLE) ×2
KIT BASIN OR (CUSTOM PROCEDURE TRAY) ×3 IMPLANT
KIT ROOM TURNOVER OR (KITS) ×3 IMPLANT
MESH VENTRALEX ST 1-7/10 CRC S (Mesh General) ×3 IMPLANT
NEEDLE 22X1 1/2 (OR ONLY) (NEEDLE) ×3 IMPLANT
NS IRRIG 1000ML POUR BTL (IV SOLUTION) ×3 IMPLANT
PACK GENERAL/GYN (CUSTOM PROCEDURE TRAY) ×3 IMPLANT
PAD ARMBOARD 7.5X6 YLW CONV (MISCELLANEOUS) ×3 IMPLANT
STAPLER VISISTAT 35W (STAPLE) IMPLANT
SUT MNCRL AB 4-0 PS2 18 (SUTURE) ×3 IMPLANT
SUT PROLENE 0 CT 2 (SUTURE) ×6 IMPLANT
SUT VIC AB 2-0 SH 27 (SUTURE) ×4
SUT VIC AB 2-0 SH 27X BRD (SUTURE) ×2 IMPLANT
SUT VIC AB 3-0 SH 27 (SUTURE) ×4
SUT VIC AB 3-0 SH 27X BRD (SUTURE) ×2 IMPLANT
SYR CONTROL 10ML LL (SYRINGE) ×3 IMPLANT
TOWEL OR 17X24 6PK STRL BLUE (TOWEL DISPOSABLE) ×3 IMPLANT
TOWEL OR 17X26 10 PK STRL BLUE (TOWEL DISPOSABLE) ×3 IMPLANT
TRAY FOLEY CATH 14FRSI W/METER (CATHETERS) IMPLANT
WATER STERILE IRR 1000ML POUR (IV SOLUTION) IMPLANT

## 2014-01-16 NOTE — Anesthesia Postprocedure Evaluation (Signed)
  Anesthesia Post-op Note  Patient: Ray Clark  Procedure(s) Performed: Procedure(s): REPAIR INCISIONAL HERNIA  (Right) INSERTION OF MESH (Right)  Patient Location: PACU  Anesthesia Type:General  Level of Consciousness: awake and alert   Airway and Oxygen Therapy: Patient Spontanous Breathing and Patient connected to nasal cannula oxygen  Post-op Pain: mild  Post-op Assessment: Post-op Vital signs reviewed, Patient's Cardiovascular Status Stable and Respiratory Function Stable  Post-op Vital Signs: Reviewed and stable  Last Vitals:  Filed Vitals:   01/16/14 1417  BP:   Pulse:   Temp: 36.4 C  Resp:     Complications: No apparent anesthesia complications

## 2014-01-16 NOTE — Progress Notes (Signed)
Called Dr.Manny to sign pt out

## 2014-01-16 NOTE — Interval H&P Note (Signed)
History and Physical Interval Note:  01/16/2014 10:52 AM  Ray Clark  has presented today for surgery, with the diagnosis of incisional hernia right lower quadrant  The various methods of treatment have been discussed with the patient and family. After consideration of risks, benefits and other options for treatment, the patient has consented to  Procedure(s): REPAIR INCISIONAL HERNIA  (N/A) INSERTION OF MESH (N/A) as a surgical intervention .  The patient's history has been reviewed, patient examined, no change in status, stable for surgery.  I have reviewed the patient's chart and labs.  Questions were answered to the patient's satisfaction.     Donyell Carrell E
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
## 2014-01-16 NOTE — Transfer of Care (Signed)
Immediate Anesthesia Transfer of Care Note  Patient: Ray Clark  Procedure(s) Performed: Procedure(s): REPAIR INCISIONAL HERNIA  (Right) INSERTION OF MESH (Right)  Patient Location: PACU  Anesthesia Type:General  Level of Consciousness: awake, alert , oriented and patient cooperative  Airway & Oxygen Therapy: Patient Spontanous Breathing and Patient connected to nasal cannula oxygen  Post-op Assessment: Report given to PACU RN, Post -op Vital signs reviewed and stable and Patient moving all extremities  Post vital signs: Reviewed and stable  Complications: No apparent anesthesia complications

## 2014-01-16 NOTE — Anesthesia Preprocedure Evaluation (Signed)
Anesthesia Evaluation   Patient awake    Reviewed: Allergy & Precautions, H&P , NPO status , Patient's Chart, lab work & pertinent test results  Airway Mallampati: I  TM Distance: >3 FB Neck ROM: Full    Dental   Pulmonary          Cardiovascular  ASD repaired via catheter   Neuro/Psych    GI/Hepatic   Endo/Other  diabetes, Well Controlled, Type 2, Oral Hypoglycemic Agents  Renal/GU      Musculoskeletal   Abdominal   Peds  Hematology   Anesthesia Other Findings   Reproductive/Obstetrics                             Anesthesia Physical Anesthesia Plan  ASA: III  Anesthesia Plan: General   Post-op Pain Management:    Induction: Intravenous  Airway Management Planned: Oral ETT  Additional Equipment:   Intra-op Plan:   Post-operative Plan: Extubation in OR  Informed Consent: I have reviewed the patients History and Physical, chart, labs and discussed the procedure including the risks, benefits and alternatives for the proposed anesthesia with the patient or authorized representative who has indicated his/her understanding and acceptance.     Plan Discussed with: CRNA and Surgeon  Anesthesia Plan Comments:         Anesthesia Quick Evaluation

## 2014-01-16 NOTE — Op Note (Signed)
01/16/2014  12:34 PM  PATIENT:  Ray NorfolkKashyap M Tandy  48 y.o. male  PRE-OPERATIVE DIAGNOSIS:  incisional hernia right lower quadrant  POST-OPERATIVE DIAGNOSIS:  incisional hernia right lower quadrant  PROCEDURE:  Procedure(s): REPAIR INCISIONAL HERNIA  INSERTION OF MESH  SURGEON:  Surgeon(s): Violeta GelinasBurke Kaia Depaolis, MD  ASSISTANTS: none   ANESTHESIA:   local and general  EBL:  Total I/O In: 1000 [I.V.:1000] Out: -   BLOOD ADMINISTERED:none  DRAINS: none   SPECIMEN:  No Specimen  DISPOSITION OF SPECIMEN:  N/A  COUNTS:  YES  DICTATION: .Dragon Dictation [name] presents for repair of incisional hernia with mesh. His site was marked in the preop holding area. Informed consent was obtained. He received intravenous antibiotics. He was taken to The operating room and general endotracheal anesthesia was administered by the anesthesia staff. Abdomen was prepped and draped in a sterile fashion. We did time out procedure. Local anesthetic was injected. Elliptical incision was made to completely encompass the old scar. Subcutis tissues were then dissected and the skin was removed. Careful dissection entered the hernia sac. This was excised from the outer portion of the fascia carefully. A small knuckle of small bowel was reduced easily back into the abdomen. There were some further filmy adhesions which were taken down easily. No enterotomies or difficulties were encountered. Some further gentle adhesiolysis was done inside to clear off the fascia circumferentially. Bowel was rechecked and hemostasis was ensured. The hernia defect itself was only about a centimeter across. A 4.3 cm Bard circular hernia mesh was then inserted. The superior mesh flap was tacked to the fascia with multiple interrupted 0 Prolene sutures. Similarly, the inferior mesh flap was tacked to the fascia with multiple interrupted 0 Prolene sutures. The flaps were trimmed. A medial and lateral 0 Prolene stitch was also placed from the  fascia to the mesh. The mesh laid nicely up against the abdominal wall. Subcutis flaps were raised all around the incision. Subcutaneous tissues tissues were closed with multiple interrupted 2-0 Vicryl sutures. This covered the mesh nicely. X, superficial tissues were closed with interrupted 3-0 Vicryl sutures and the skin was closed with running 4-0 Monocryl subcuticular stitch followed by Dermabond. All counts were correct. Patient tolerated procedure well without apparent complication and was taken recovery in stable condition.  PATIENT DISPOSITION:  PACU - hemodynamically stable.   Delay start of Pharmacological VTE agent (>24hrs) due to surgical blood loss or risk of bleeding:  no  Violeta GelinasBurke Arta Stump, MD, MPH, FACS Pager: [phone]  11/30/201512:34 PM

## 2014-01-17 ENCOUNTER — Encounter (HOSPITAL_COMMUNITY): Payer: Self-pay | Admitting: General Surgery

## 2014-01-17 DIAGNOSIS — K432 Incisional hernia without obstruction or gangrene: Secondary | ICD-10-CM | POA: Diagnosis not present

## 2014-01-17 LAB — BASIC METABOLIC PANEL
ANION GAP: 11 (ref 5–15)
BUN: 8 mg/dL (ref 6–23)
CHLORIDE: 100 meq/L (ref 96–112)
CO2: 28 meq/L (ref 19–32)
CREATININE: 0.85 mg/dL (ref 0.50–1.35)
Calcium: 9 mg/dL (ref 8.4–10.5)
GFR calc Af Amer: >90 mL/min (ref >90)
GFR calc non Af Amer: >90 mL/min (ref >90)
GLUCOSE: 228 mg/dL — AB (ref 70–99)
Potassium: 4.7 mEq/L (ref 3.7–5.3)
Sodium: 139 mEq/L (ref 137–147)

## 2014-01-17 LAB — GLUCOSE, CAPILLARY
GLUCOSE-CAPILLARY: 231 mg/dL — AB (ref 70–99)
Glucose-Capillary: 148 mg/dL — ABNORMAL HIGH (ref 70–99)
Glucose-Capillary: 183 mg/dL — ABNORMAL HIGH (ref 70–99)
Glucose-Capillary: 144 mg/dL — ABNORMAL HIGH (ref 70–99)

## 2014-01-17 LAB — CBC
HEMATOCRIT: 38.2 % — AB (ref 39.0–52.0)
Hemoglobin: 12.2 g/dL — ABNORMAL LOW (ref 13.0–17.0)
MCH: 26.1 pg (ref 26.0–34.0)
MCHC: 31.9 g/dL (ref 30.0–36.0)
MCV: 81.8 fL (ref 78.0–100.0)
PLATELETS: 248 10*3/uL (ref 150–400)
RBC: 4.67 MIL/uL (ref 4.22–5.81)
RDW: 14.6 % (ref 11.5–15.5)
WBC: 7.5 10*3/uL (ref 4.0–10.5)

## 2014-01-17 MED ORDER — OXYCODONE-ACETAMINOPHEN 5-325 MG PO TABS: 1.0000 | ORAL_TABLET | ORAL | Status: DC | PRN

## 2014-01-17 MED ORDER — GUAIFENESIN ER 600 MG PO TB12
600.0000 mg | ORAL_TABLET | Freq: Two times a day (BID) | ORAL | Status: DC
Start: 2014-01-17 — End: 2014-01-17
  Administered 2014-01-17: 600 mg via ORAL
  Filled 2014-01-17: qty 1

## 2014-01-17 MED ORDER — TRAMADOL HCL 50 MG PO TABS: 50.0000 mg | ORAL_TABLET | Freq: Four times a day (QID) | ORAL | Status: DC | PRN

## 2014-01-17 NOTE — Plan of Care (Signed)
Problem: Phase I Progression Outcomes Goal: Pain controlled with appropriate interventions Outcome: Completed/Met Date Met:  01/17/14 Goal: OOB as tolerated unless otherwise ordered Outcome: Completed/Met Date Met:  01/17/14 Goal: Incision/dressings dry and intact Outcome: Completed/Met Date Met:  01/17/14 Goal: Voiding-avoid urinary catheter unless indicated Outcome: Completed/Met Date Met:  01/17/14

## 2014-01-17 NOTE — Progress Notes (Signed)
UR completed 

## 2014-01-17 NOTE — Discharge Summary (Signed)
Physician Discharge Summary  Patient ID: Ray Clark MRN: [id number] DOB/AGE: Mar 19, 1965 48 y.o.  Admit date: 01/16/2014 Discharge date: 01/17/2014  Admission Diagnoses:incisional hernia  Discharge Diagnoses: s/p repair incisional hernia with mesh Active Problems:   S/P hernia repair   Discharged Condition: good  Hospital Course: patient underwent repair of incisional hernia RLQ with mesh. Post-op he did well and had good pain control on oral meds. D/C home on POD#1.  Consults: None  Significant Diagnostic Studies: none  Treatments: surgery: above  Discharge Exam: Blood pressure 96/60, pulse 72, temperature 98.4 F (36.9 C), temperature source Oral, resp. rate 16, height 5\' 6"  (1.676 m), weight 141 lb 12.8 oz (64.32 kg), SpO2 98 %. incision CDI  Disposition: 01-Home or Self Care  Discharge Instructions    Diet - low sodium heart healthy    Complete by:  As directed      Discharge instructions    Complete by:  As directed   [facility] _______[facility]  UMBILICAL OR INGUINAL HERNIA REPAIR: POST OP INSTRUCTIONS  Always review your discharge instruction sheet given to you by the facility where your surgery was performed. IF YOU HAVE DISABILITY OR FAMILY LEAVE FORMS, YOU MUST BRING THEM TO THE OFFICE FOR PROCESSING.   DO NOT GIVE THEM TO YOUR DOCTOR.  A  prescription for pain medication may be given to you upon discharge.  Take your pain medication as prescribed, if needed.  If narcotic pain medicine is not needed, then you may take acetaminophen (Tylenol) or ibuprofen (Advil) as needed. Take your usually prescribed medications unless otherwise directed. If you need a refill on your pain medication, please contact your pharmacy.  They will contact our office to request authorization. Prescriptions will not be filled after 5 pm or on week-ends. You should follow a light diet the first 24 hours after arrival home, such as soup and crackers, etc.  Be sure to  include lots of fluids daily.  Resume your normal diet the day after surgery. Most patients will experience some swelling and bruising around the umbilicus or in the groin and scrotum.  Ice packs and reclining will help.  Swelling and bruising can take several days to resolve.  It is common to experience some constipation if taking pain medication after surgery.  Increasing fluid intake and taking a stool softener (such as Colace) will usually help or prevent this problem from occurring.  A mild laxative (Milk of Magnesia or Miralax) should be taken according to package directions if there are no bowel movements after 48 hours. Unless discharge instructions indicate otherwise, you may remove your bandages 24-48 hours after surgery, and you may shower at that time.  You may have steri-strips (small skin tapes) in place directly over the incision.  These strips should be left on the skin for 7-10 days.  If your surgeon used skin glue on the incision, you may shower in 24 hours.  The glue will flake off over the next 2-3 weeks.  Any sutures or staples will be removed at the office during your follow-up visit. ACTIVITIES:  You may resume regular (light) daily activities beginning the next day-such as daily self-care, walking, climbing stairs-gradually increasing activities as tolerated.  You may have sexual intercourse when it is comfortable.  Refrain from any heavy lifting or straining until approved by your doctor. You may drive when you are no longer taking prescription pain medication, you can comfortably wear a seatbelt, and you can safely maneuver your car and  apply brakes. RETURN TO WORK:  __________________________________________________________ Bonita QuinYou should see your doctor in the office for a follow-up appointment approximately 2-3 weeks after your surgery.  Make sure that you call for this appointment within a day or two after you arrive home to insure a convenient appointment time. OTHER INSTRUCTIONS:   __________________________________________________________________________________________________________________________________________________________________________________________  WHEN TO CALL YOUR DOCTOR: Fever over 101.0 Inability to urinate Nausea and/or vomiting Extreme swelling or bruising Continued bleeding from incision. Increased pain, redness, or drainage from the incision  The clinic staff is available to answer your questions during regular business hours.  Please don't hesitate to call and ask to speak to one of the nurses for clinical concerns.  If you have a medical emergency, go to the nearest emergency room or call 911.  A [facility] from [facility] is always on call at the hospital   [address], [address], [place], [place]  [id number] ?  [address] [address], [place], [place]   [id number] [phone] ? [phone] ? FAX [phone] Web site: [email or web address]     Increase activity slowly    Complete by:  As directed      Lifting restrictions    Complete by:  As directed   No lifting over 10lbs for 6 weeks     No dressing needed    Complete by:  As directed             Medication List    TAKE these medications        acetaminophen 325 MG tablet  Commonly known as:  TYLENOL  Take 650 mg by mouth every 6 (six) hours as needed for mild pain or headache.     aspirin EC 81 MG tablet  Take 1 tablet (81 mg total) by mouth daily.     clopidogrel 75 MG tablet  Commonly known as:  PLAVIX  Take 1 tablet (75 mg total) by mouth daily.     escitalopram 20 MG tablet  Commonly known as:  LEXAPRO  Take 20 mg by mouth daily.     metFORMIN 500 MG tablet  Commonly known as:  GLUCOPHAGE  Take 1 tablet (500 mg total) by mouth 2 (two) times daily with a meal.     OVER THE COUNTER MEDICATION  Take 4 tablets by mouth every evening. Ayurvedic taken with metformin     oxyCODONE-acetaminophen 5-325 MG per tablet  Commonly known as:   PERCOCET/ROXICET  Take 1-2 tablets by mouth every 4 (four) hours as needed (pain).     traMADol 50 MG tablet  Commonly known as:  ULTRAM  Take 1 tablet (50 mg total) by mouth every 6 (six) hours as needed for moderate pain.         SignedLiz Malady: Drezden Seitzinger E 01/17/2014, 3:51 PM

## 2014-01-17 NOTE — Anesthesia Postprocedure Evaluation (Signed)
Anesthesia Post Note  Patient: Ray Clark  Procedure(s) Performed: Procedure(s) (LRB): REPAIR INCISIONAL HERNIA  (Right) INSERTION OF MESH (Right)  Anesthesia type: general  Patient location: PACU  Post pain: Pain level controlled  Post assessment: Patient's Cardiovascular Status Stable  Last Vitals:  Filed Vitals:   01/17/14 0503  BP: 106/65  Pulse: 74  Temp: 37.2 C  Resp: 17    Post vital signs: Reviewed and stable  Level of consciousness: sedated  Complications: No apparent anesthesia complications

## 2014-01-17 NOTE — Progress Notes (Addendum)
1 Day Post-Op  Subjective: Quite sore, has only had IV pain meds  Objective: Vital signs in last 24 hours: Temp:  [97.1 F (36.2 C)-98.9 F (37.2 C)] 98.9 F (37.2 C) (12/01 0503) Pulse Rate:  [64-81] 74 (12/01 0503) Resp:  [9-18] 17 (12/01 0503) BP: (103-120)/(57-80) 106/65 mmHg (12/01 0503) SpO2:  [93 %-100 %] 95 % (12/01 0503) Weight:  [141 lb 12.8 oz (64.32 kg)] 141 lb 12.8 oz (64.32 kg) (11/30 0913) Last BM Date:  (prior to admission)  Intake/Output from previous day: 11/30 0701 - 12/01 0700 In: 2862.5 [P.O.:120; I.V.:2742.5] Out: -  Intake/Output this shift:   Up in chair GI: soft, incision CDI, no gen TTP  Lab Results:   Recent Labs  01/16/14 1515 01/17/14 0500  WBC 4.4 7.5  HGB 11.4* 12.2*  HCT 35.9* 38.2*  PLT 208 248   BMET  Recent Labs  01/16/14 1515 01/17/14 0500  NA  --  139  K  --  4.7  CL  --  100  CO2  --  28  GLUCOSE  --  228*  BUN  --  8  CREATININE 0.75 0.85  CALCIUM  --  9.0   PT/INR No results for input(s): LABPROT, INR in the last 72 hours. ABG No results for input(s): PHART, HCO3 in the last 72 hours.  Invalid input(s): PCO2, PO2  Studies/Results: No results found.  Anti-infectives: Anti-infectives    Start     Dose/Rate Route Frequency Ordered Stop   01/16/14 0600  ceFAZolin (ANCEF) IVPB 2 g/50 mL premix     2 g100 mL/hr over 30 Minutes Intravenous On call to O.R. 01/15/14 1355 01/16/14 1200      Assessment/Plan: s/p Procedure(s): REPAIR INCISIONAL HERNIA  (Right) INSERTION OF MESH (Right) POD#1 FEN - tol diet, needs to be able to control pain with POs, Percocet plus Ultram VTE - Lovenox DM - SSI Dispo - check this PM for possible D/C depending on pain control  LOS: 1 day    Damiah Mcdonald E 01/17/2014

## 2014-01-17 NOTE — Plan of Care (Signed)
Problem: Phase I Progression Outcomes Goal: Sutures/staples intact Outcome: Not Applicable Date Met:  48/30/73 Goal: Tubes/drains patent Outcome: Not Applicable Date Met:  54/30/14 Goal: Vital signs/hemodynamically stable Outcome: Completed/Met Date Met:  01/17/14

## 2014-01-26 ENCOUNTER — Encounter (HOSPITAL_COMMUNITY): Payer: Self-pay | Admitting: Cardiovascular Disease

## 2014-05-24 ENCOUNTER — Other Ambulatory Visit (HOSPITAL_COMMUNITY): Payer: Self-pay

## 2014-05-31 ENCOUNTER — Ambulatory Visit (INDEPENDENT_AMBULATORY_CARE_PROVIDER_SITE_OTHER): Payer: 59 | Admitting: Cardiology

## 2014-05-31 ENCOUNTER — Ambulatory Visit: Payer: Self-pay | Admitting: Cardiology

## 2014-05-31 ENCOUNTER — Ambulatory Visit (HOSPITAL_COMMUNITY): Payer: 59 | Attending: Cardiology | Admitting: Radiology

## 2014-05-31 ENCOUNTER — Encounter: Payer: Self-pay | Admitting: Cardiology

## 2014-05-31 ENCOUNTER — Other Ambulatory Visit (HOSPITAL_COMMUNITY): Payer: Self-pay

## 2014-05-31 ENCOUNTER — Encounter (HOSPITAL_COMMUNITY): Payer: Self-pay

## 2014-05-31 VITALS — BP 90/60 | HR 85 | Ht 66.0 in | Wt 143.4 lb

## 2014-05-31 DIAGNOSIS — Q211 Atrial septal defect, unspecified: Secondary | ICD-10-CM

## 2014-05-31 DIAGNOSIS — R079 Chest pain, unspecified: Secondary | ICD-10-CM

## 2014-05-31 DIAGNOSIS — E119 Type 2 diabetes mellitus without complications: Secondary | ICD-10-CM | POA: Diagnosis not present

## 2014-05-31 DIAGNOSIS — Z86711 Personal history of pulmonary embolism: Secondary | ICD-10-CM | POA: Insufficient documentation

## 2014-05-31 DIAGNOSIS — E785 Hyperlipidemia, unspecified: Secondary | ICD-10-CM

## 2014-05-31 DIAGNOSIS — Q2111 Secundum atrial septal defect: Secondary | ICD-10-CM

## 2014-05-31 LAB — COMPREHENSIVE METABOLIC PANEL WITH GFR
ALT: 22 U/L (ref 0–53)
AST: 20 U/L (ref 0–37)
Albumin: 4 g/dL (ref 3.5–5.2)
Alkaline Phosphatase: 80 U/L (ref 39–117)
BUN: 11 mg/dL (ref 6–23)
CO2: 28 meq/L (ref 19–32)
Calcium: 9.6 mg/dL (ref 8.4–10.5)
Chloride: 101 meq/L (ref 96–112)
Creatinine, Ser: 0.92 mg/dL (ref 0.40–1.50)
GFR: 92.68 mL/min
Glucose, Bld: 189 mg/dL — ABNORMAL HIGH (ref 70–99)
Potassium: 4.4 meq/L (ref 3.5–5.1)
Sodium: 134 meq/L — ABNORMAL LOW (ref 135–145)
Total Bilirubin: 0.4 mg/dL (ref 0.2–1.2)
Total Protein: 6.7 g/dL (ref 6.0–8.3)

## 2014-05-31 NOTE — Addendum Note (Signed)
Addended by: Loa SocksMARTIN, Jaina Morin M on: 05/31/2014 09:52 AM   Modules accepted: Orders, Medications

## 2014-05-31 NOTE — Patient Instructions (Signed)
Medication Instructions:  Your physician recommends that you continue on your current medications as directed. Please refer to the Current Medication list given to you today.   Labwork:  TODAY-   CHECK CMET AND NMR W LIPIDS  Testing/Procedures:  Your physician has requested that you have an echocardiogram. Echocardiography is a painless test that uses sound waves to create images of your heart. It provides your doctor with information about the size and shape of your heart and how well your heart's chambers and valves are working. This procedure takes approximately one hour. There are no restrictions for this procedure.  HAVE THIS SCHEDULED PRIOR TO YOUR 6 MONTH OFFICE VISIT WITH DR Delton SeeNELSON    Follow-Up:  Your physician wants you to follow-up in: 6 MONTHS WITH DR Johnell ComingsNELSON You will receive a reminder letter in the mail two months in advance. If you don't receive a letter, please call our office to schedule the follow-up appointment.

## 2014-05-31 NOTE — Progress Notes (Unsigned)
IV 22G Angiocath (R) AC x 1 , tolerated well for Bubble Study. Irean HongPatsy Leah Skora, RN.

## 2014-05-31 NOTE — Progress Notes (Signed)
Limited Echocardiogram performed with bubble study.  
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
## 2014-06-02 LAB — NMR LIPOPROFILE WITH LIPIDS
Cholesterol, Total: 239 mg/dL — ABNORMAL HIGH (ref 100–199)
HDL Particle Number: 36.9 umol/L (ref >=30.5)
HDL Size: 9 nm — ABNORMAL LOW (ref >=9.2)
HDL-C: 44 mg/dL (ref >39)
LDL Particle Number: 1794 nmol/L — ABNORMAL HIGH (ref <1000)
LDL Size: 19.6 nm (ref >=20.8)
LP-IR Score: 64 — ABNORMAL HIGH (ref <=45)
Large HDL-P: 7.1 umol/L (ref >=4.8)
Large VLDL-P: 15.7 nmol/L — ABNORMAL HIGH (ref <=2.7)
Small LDL Particle Number: 1155 nmol/L — ABNORMAL HIGH (ref <=527)
Triglycerides: 472 mg/dL — ABNORMAL HIGH (ref 0–149)
VLDL Size: 50.8 nm — ABNORMAL HIGH (ref <=46.6)

## 2014-06-05 ENCOUNTER — Telehealth: Payer: Self-pay | Admitting: *Deleted

## 2014-06-05 DIAGNOSIS — E785 Hyperlipidemia, unspecified: Secondary | ICD-10-CM

## 2014-06-05 MED ORDER — ATORVASTATIN CALCIUM 20 MG PO TABS: 20.0000 mg | ORAL_TABLET | Freq: Every day | ORAL | Status: DC

## 2014-06-05 NOTE — Telephone Encounter (Signed)
-----   Message from Lars MassonKatarina H Nelson, MD sent at 06/04/2014  5:42 PM EDT ----- He has very high LDL and triglycerides, I would start him on atorvstatin 20 mg po daily, followed by CMP and fasting lipids in 2 months.

## 2014-06-05 NOTE — Telephone Encounter (Signed)
Contacted the pt and wife to inform them that per Dr Delton SeeNelson the pts NMR w lipids showed very high LDL and triglycerides, and she recommends the pt start taking atorvastatin 20 mg po daily, and check a CMET and fasting lipids in 2 months.  Confirmed the pharmacy of choice with both parties.  Lab appt made at our office for 6/15 to check a cmet and lipids.  Both parties verbalized understanding and agree with this plan.

## 2014-06-23 ENCOUNTER — Ambulatory Visit: Payer: Self-pay | Admitting: Cardiology

## 2014-06-23 ENCOUNTER — Other Ambulatory Visit (HOSPITAL_COMMUNITY): Payer: Self-pay

## 2014-08-02 ENCOUNTER — Other Ambulatory Visit (INDEPENDENT_AMBULATORY_CARE_PROVIDER_SITE_OTHER): Payer: 59 | Admitting: *Deleted

## 2014-08-02 DIAGNOSIS — E785 Hyperlipidemia, unspecified: Secondary | ICD-10-CM | POA: Diagnosis not present

## 2014-08-02 LAB — LIPID PANEL
Cholesterol: 91 mg/dL (ref 0–200)
HDL: 40.9 mg/dL (ref >39.00)
LDL Cholesterol: 24 mg/dL (ref 0–99)
NonHDL: 50.1
Total CHOL/HDL Ratio: 2
Triglycerides: 132 mg/dL (ref 0.0–149.0)
VLDL: 26.4 mg/dL (ref 0.0–40.0)

## 2014-08-02 LAB — COMPREHENSIVE METABOLIC PANEL
ALT: 21 U/L (ref 0–53)
AST: 19 U/L (ref 0–37)
Albumin: 4.1 g/dL (ref 3.5–5.2)
Alkaline Phosphatase: 67 U/L (ref 39–117)
BUN: 10 mg/dL (ref 6–23)
CO2: 31 mEq/L (ref 19–32)
Calcium: 9.1 mg/dL (ref 8.4–10.5)
Chloride: 102 mEq/L (ref 96–112)
Creatinine, Ser: 0.88 mg/dL (ref 0.40–1.50)
GFR: 97.49 mL/min (ref >60.00)
Glucose, Bld: 223 mg/dL — ABNORMAL HIGH (ref 70–99)
Potassium: 4.3 mEq/L (ref 3.5–5.1)
Sodium: 137 mEq/L (ref 135–145)
Total Bilirubin: 0.4 mg/dL (ref 0.2–1.2)
Total Protein: 6 g/dL (ref 6.0–8.3)

## 2014-08-02 NOTE — Addendum Note (Signed)
Addended by: Tonita Phoenix on: 08/02/2014 08:40 AM   Modules accepted: Orders

## 2014-11-15 ENCOUNTER — Other Ambulatory Visit (HOSPITAL_COMMUNITY): Payer: 59

## 2014-11-15 ENCOUNTER — Ambulatory Visit (HOSPITAL_COMMUNITY): Payer: 59

## 2014-11-23 ENCOUNTER — Other Ambulatory Visit: Payer: Self-pay

## 2014-11-23 ENCOUNTER — Ambulatory Visit (HOSPITAL_COMMUNITY): Payer: 59 | Attending: Cardiovascular Disease

## 2014-11-23 DIAGNOSIS — R079 Chest pain, unspecified: Secondary | ICD-10-CM | POA: Diagnosis not present

## 2014-11-23 DIAGNOSIS — Q2111 Secundum atrial septal defect: Secondary | ICD-10-CM

## 2014-11-23 DIAGNOSIS — Q211 Atrial septal defect: Secondary | ICD-10-CM

## 2014-11-23 DIAGNOSIS — E785 Hyperlipidemia, unspecified: Secondary | ICD-10-CM

## 2014-12-08 ENCOUNTER — Ambulatory Visit: Payer: 59 | Admitting: Cardiology

## 2015-01-16 IMAGING — CR DG CHEST 2V
2 series · 2 of 2 positions shown · non-contrast
Comparison: CT chest 06/07/2013.  PA and lateral chest 06/06/2013.

CLINICAL DATA: Atrial septal defect closure.

EXAM:
CHEST  2 VIEW

[w chest pa]
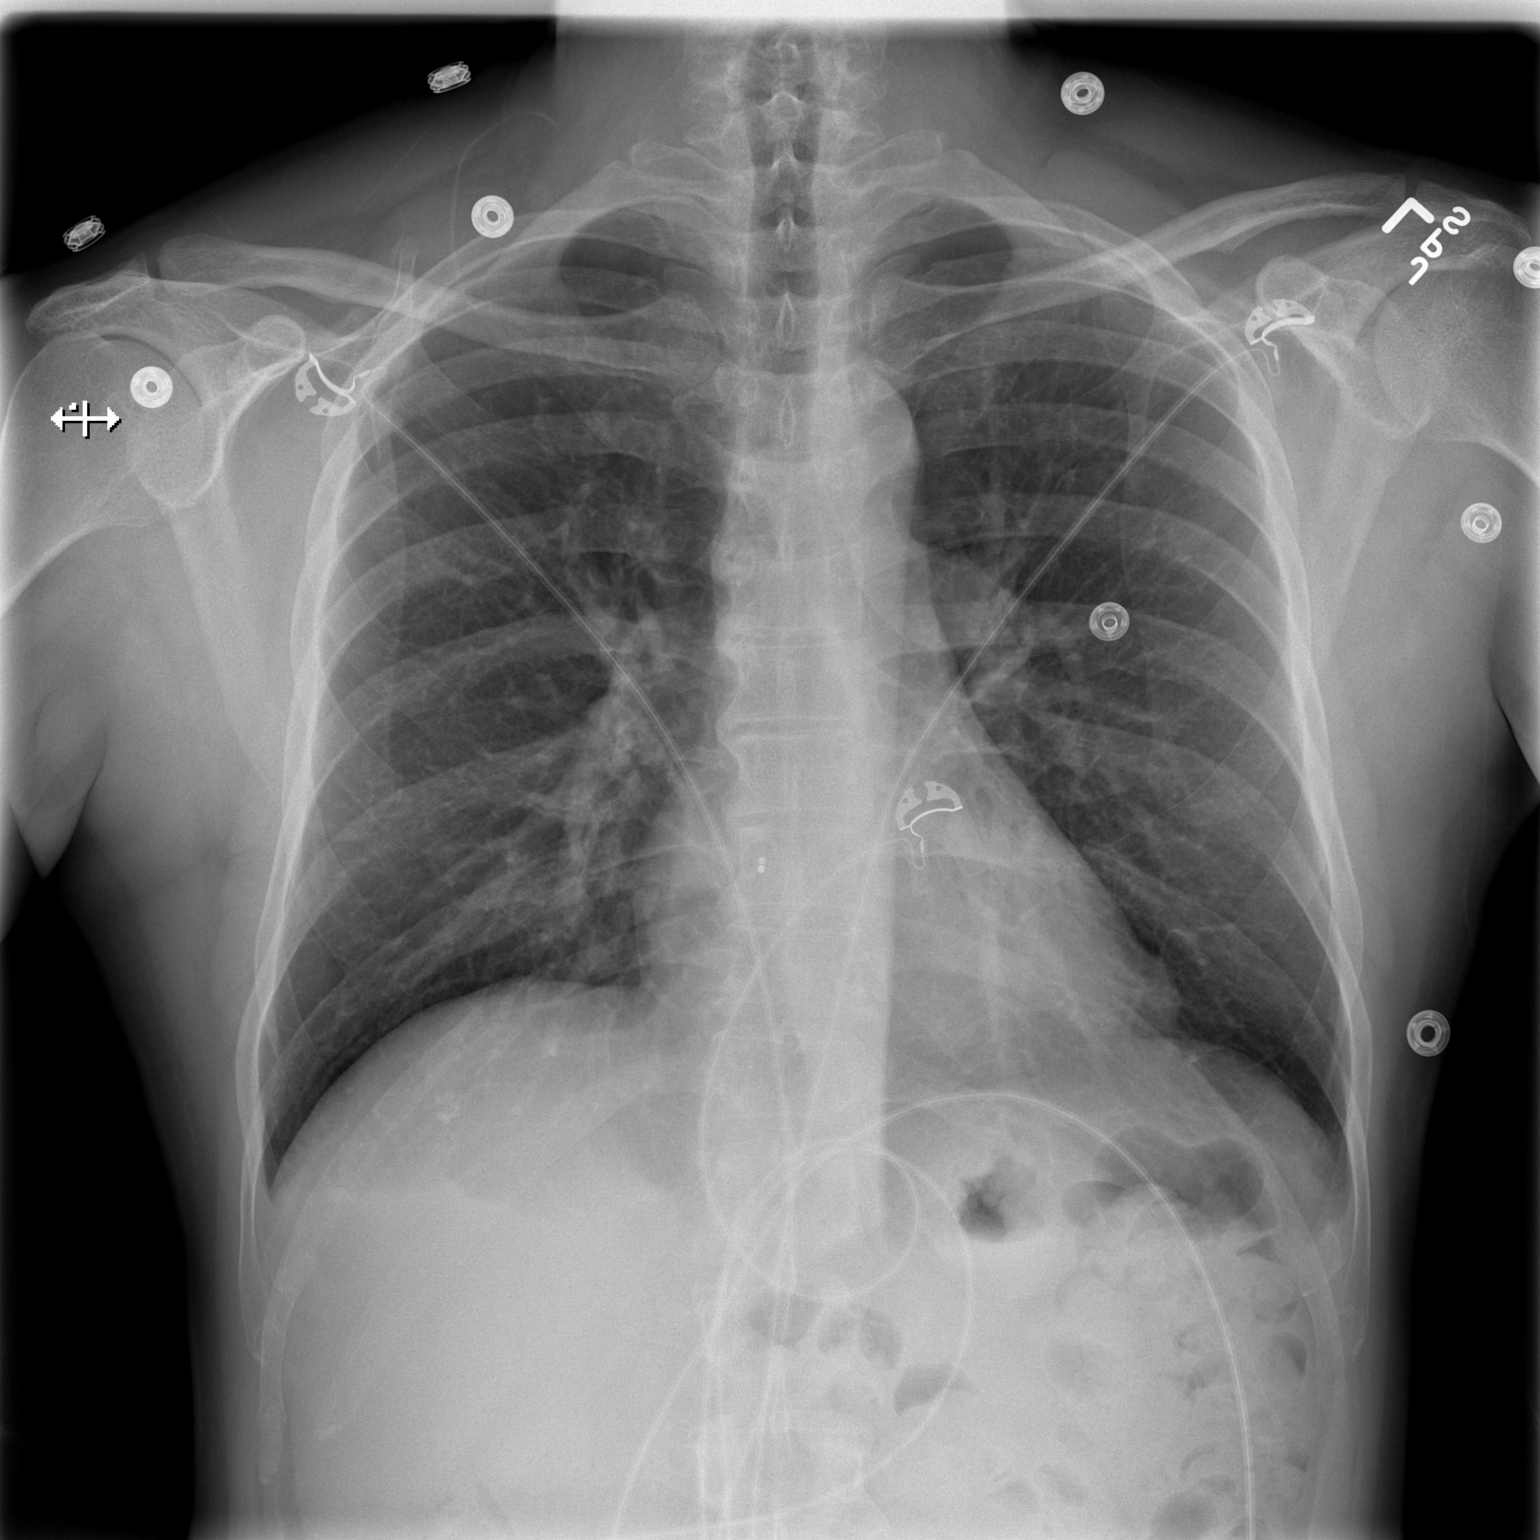

[w chest lat]
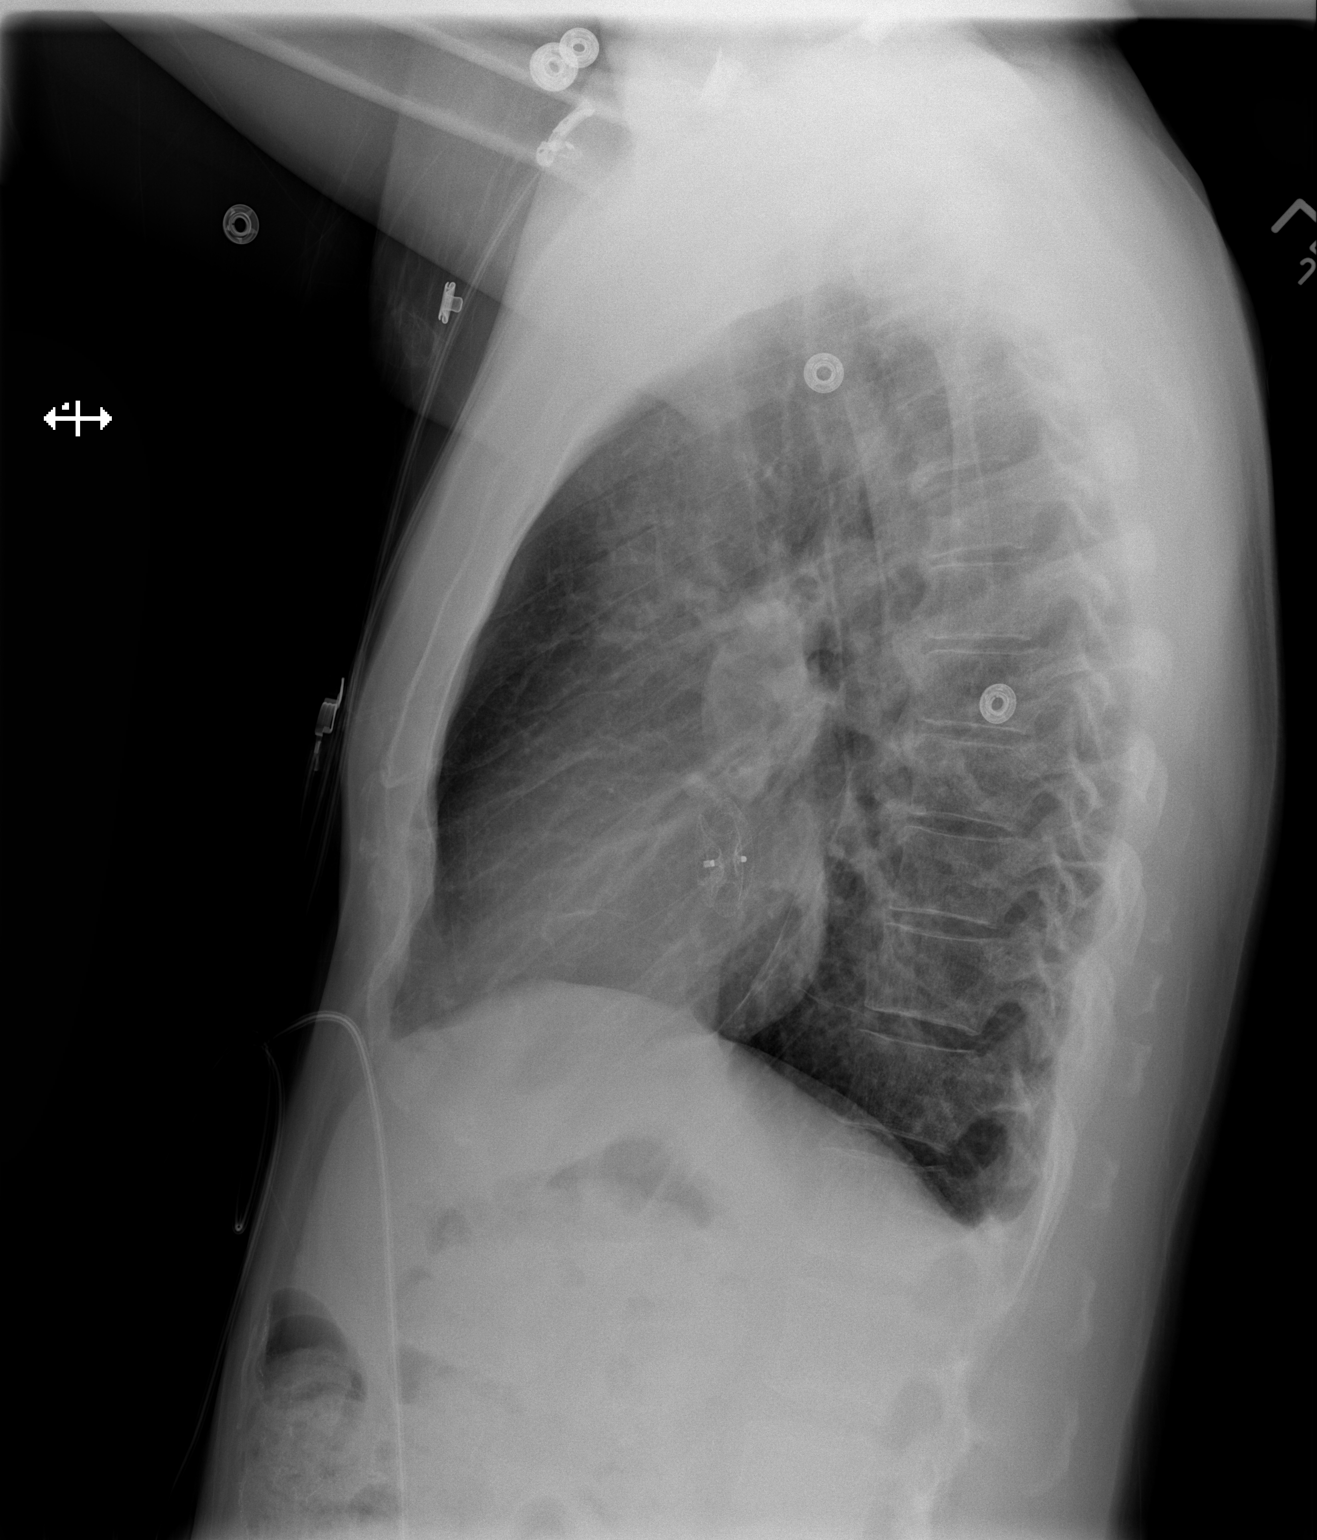

[2 of 2 positions shown; findings below may reference images not displayed]

FINDINGS: Markers for a septal occluder device are identified. The lungs are
clear. Heart size is normal. There is no pneumothorax or pleural
effusion. There is no pneumothorax. Trace bilateral pleural
effusions are noted.
IMPRESSION: Status post atrial septal closure without evidence of complication.

Trace bilateral pleural effusions.  Lungs are clear.

## 2015-08-15 ENCOUNTER — Other Ambulatory Visit: Payer: Self-pay | Admitting: General Surgery

## 2015-08-15 DIAGNOSIS — R1031 Right lower quadrant pain: Secondary | ICD-10-CM

## 2015-08-28 ENCOUNTER — Inpatient Hospital Stay: Admission: RE | Admit: 2015-08-28 | Payer: Self-pay | Source: Ambulatory Visit

## 2016-06-13 ENCOUNTER — Encounter: Payer: Self-pay | Admitting: Cardiology

## 2016-06-13 ENCOUNTER — Ambulatory Visit (INDEPENDENT_AMBULATORY_CARE_PROVIDER_SITE_OTHER): Payer: BLUE CROSS/BLUE SHIELD | Admitting: Cardiology

## 2016-06-13 VITALS — BP 110/60 | HR 99 | Ht 66.0 in | Wt 140.0 lb

## 2016-06-13 DIAGNOSIS — R079 Chest pain, unspecified: Secondary | ICD-10-CM | POA: Diagnosis not present

## 2016-06-13 NOTE — Patient Instructions (Signed)
Your physician has requested that you have en exercise stress myoview. For further information please visit [email or web address] Please follow instruction sheet, as given.  Your physician recommends that you schedule a follow-up appointment with Dr. Delton See after your stress test
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
## 2016-06-24 ENCOUNTER — Telehealth (HOSPITAL_COMMUNITY): Payer: Self-pay | Admitting: *Deleted

## 2016-06-24 NOTE — Telephone Encounter (Signed)
Patient given detailed instructions per Myocardial Perfusion Study Information Sheet for the test on 06/27/16 at 1100. Patient notified to arrive 15 minutes early and that it is imperative to arrive on time for appointment to keep from having the test rescheduled.  If you need to cancel or reschedule your appointment, please call the office within 24 hours of your appointment. Failure to do so may result in a cancellation of your appointment, and a $50 no show fee. Patient verbalized understanding.Shaunn Tackitt, Adelene IdlerCynthia W

## 2016-06-27 ENCOUNTER — Ambulatory Visit (HOSPITAL_COMMUNITY): Payer: BLUE CROSS/BLUE SHIELD | Attending: Cardiology

## 2016-06-27 DIAGNOSIS — E119 Type 2 diabetes mellitus without complications: Secondary | ICD-10-CM | POA: Diagnosis not present

## 2016-06-27 DIAGNOSIS — R079 Chest pain, unspecified: Secondary | ICD-10-CM | POA: Diagnosis not present

## 2016-06-27 MED ORDER — TECHNETIUM TC 99M TETROFOSMIN IV KIT
33.0000 | PACK | Freq: Once | INTRAVENOUS | Status: AC | PRN
  Administered 2016-06-27: 33 via INTRAVENOUS
  Filled 2016-06-27: qty 33

## 2016-06-27 MED ORDER — TECHNETIUM TC 99M TETROFOSMIN IV KIT
10.4000 | PACK | Freq: Once | INTRAVENOUS | Status: AC | PRN
  Administered 2016-06-27: 10.4 via INTRAVENOUS
  Filled 2016-06-27: qty 11

## 2016-06-30 LAB — MYOCARDIAL PERFUSION IMAGING
CHL CUP MPHR: 169 {beats}/min
CHL CUP RESTING HR STRESS: 75 {beats}/min
CSEPEDS: 0 s
CSEPEW: 10.1 METS
Exercise duration (min): 8 min
Peak HR: 166 {beats}/min
Percent HR: 98 %

## 2016-08-15 ENCOUNTER — Other Ambulatory Visit: Payer: Self-pay | Admitting: Internal Medicine

## 2016-08-15 ENCOUNTER — Ambulatory Visit
Admission: RE | Admit: 2016-08-15 | Discharge: 2016-08-15 | Disposition: A | Discharge status: Home / Self Care | Payer: BLUE CROSS/BLUE SHIELD | Source: Ambulatory Visit | Attending: Internal Medicine | Admitting: Internal Medicine

## 2016-08-15 DIAGNOSIS — R0789 Other chest pain: Secondary | ICD-10-CM

## 2016-09-04 ENCOUNTER — Ambulatory Visit (INDEPENDENT_AMBULATORY_CARE_PROVIDER_SITE_OTHER): Payer: BLUE CROSS/BLUE SHIELD | Admitting: Cardiology

## 2016-09-04 ENCOUNTER — Encounter: Payer: Self-pay | Admitting: Cardiology

## 2016-09-04 VITALS — BP 124/62 | HR 90 | Ht 66.0 in | Wt 139.0 lb

## 2016-09-04 DIAGNOSIS — R072 Precordial pain: Secondary | ICD-10-CM | POA: Diagnosis not present

## 2016-09-04 DIAGNOSIS — E785 Hyperlipidemia, unspecified: Secondary | ICD-10-CM | POA: Diagnosis not present

## 2016-09-04 DIAGNOSIS — Q2111 Secundum atrial septal defect: Secondary | ICD-10-CM

## 2016-09-04 DIAGNOSIS — Q211 Atrial septal defect: Secondary | ICD-10-CM | POA: Diagnosis not present

## 2016-09-04 DIAGNOSIS — R9439 Abnormal result of other cardiovascular function study: Secondary | ICD-10-CM

## 2016-09-04 NOTE — Patient Instructions (Signed)
Medication Instructions:   Your physician recommends that you continue on your current medications as directed. Please refer to the Current Medication list given to you today.     Testing/Procedures:  CORONARY CT WITH FFR FOR DR NELSON TO READ--THIS IS FOR CHEST PAIN   Your physician has requested that you have an echocardiogram. Echocardiography is a painless test that uses sound waves to create images of your heart. It provides your doctor with information about the size and shape of your heart and how well your heart's chambers and valves are working. This procedure takes approximately one hour. There are no restrictions for this procedure.     Follow-Up:  Your physician wants you to follow-up in: 6 MONTHS WITH DR Johnell ComingsNELSON You will receive a reminder letter in the mail two months in advance. If you don't receive a letter, please call our office to schedule the follow-up appointment.        If you need a refill on your cardiac medications before your next appointment, please call your pharmacy.
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
## 2016-09-15 ENCOUNTER — Other Ambulatory Visit (HOSPITAL_COMMUNITY): Payer: BLUE CROSS/BLUE SHIELD

## 2016-09-24 ENCOUNTER — Other Ambulatory Visit (HOSPITAL_COMMUNITY): Payer: BLUE CROSS/BLUE SHIELD

## 2016-10-02 ENCOUNTER — Ambulatory Visit (HOSPITAL_COMMUNITY): Payer: BLUE CROSS/BLUE SHIELD | Attending: Cardiology

## 2016-10-02 ENCOUNTER — Other Ambulatory Visit: Payer: Self-pay

## 2016-10-02 DIAGNOSIS — I371 Nonrheumatic pulmonary valve insufficiency: Secondary | ICD-10-CM | POA: Diagnosis not present

## 2016-10-02 DIAGNOSIS — E119 Type 2 diabetes mellitus without complications: Secondary | ICD-10-CM | POA: Diagnosis not present

## 2016-10-02 DIAGNOSIS — Q211 Atrial septal defect: Secondary | ICD-10-CM

## 2016-10-02 DIAGNOSIS — Z86711 Personal history of pulmonary embolism: Secondary | ICD-10-CM | POA: Diagnosis not present

## 2016-10-02 DIAGNOSIS — R072 Precordial pain: Secondary | ICD-10-CM

## 2016-10-02 DIAGNOSIS — E785 Hyperlipidemia, unspecified: Secondary | ICD-10-CM | POA: Diagnosis not present

## 2016-10-02 DIAGNOSIS — Q2111 Secundum atrial septal defect: Secondary | ICD-10-CM

## 2016-10-08 ENCOUNTER — Encounter: Payer: Self-pay | Admitting: Cardiology

## 2016-10-22 ENCOUNTER — Ambulatory Visit (HOSPITAL_COMMUNITY)
Admission: RE | Admit: 2016-10-22 | Discharge: 2016-10-22 | Disposition: A | Discharge status: Home / Self Care | Payer: BLUE CROSS/BLUE SHIELD | Source: Ambulatory Visit | Attending: Cardiology | Admitting: Cardiology

## 2016-10-22 ENCOUNTER — Ambulatory Visit (HOSPITAL_COMMUNITY): Payer: BLUE CROSS/BLUE SHIELD

## 2016-10-22 ENCOUNTER — Encounter (HOSPITAL_COMMUNITY): Payer: Self-pay

## 2016-10-22 DIAGNOSIS — R079 Chest pain, unspecified: Secondary | ICD-10-CM | POA: Diagnosis not present

## 2016-10-22 DIAGNOSIS — R072 Precordial pain: Secondary | ICD-10-CM | POA: Insufficient documentation

## 2016-10-22 LAB — POCT I-STAT CREATININE: Creatinine, Ser: 0.7 mg/dL (ref 0.61–1.24)

## 2016-10-22 MED ORDER — NITROGLYCERIN 0.4 MG SL SUBL
SUBLINGUAL_TABLET | SUBLINGUAL | Status: AC
  Filled 2016-10-22: qty 2

## 2016-10-22 MED ORDER — IOPAMIDOL (ISOVUE-370) INJECTION 76%
INTRAVENOUS | Status: AC
  Administered 2016-10-22: 80 mL
  Filled 2016-10-22: qty 100

## 2016-10-22 MED ORDER — NITROGLYCERIN 0.4 MG SL SUBL
0.8000 mg | SUBLINGUAL_TABLET | Freq: Once | SUBLINGUAL | Status: AC
  Administered 2016-10-22: 0.8 mg via SUBLINGUAL

## 2016-10-22 MED ORDER — METOPROLOL TARTRATE 5 MG/5ML IV SOLN
INTRAVENOUS | Status: AC
  Filled 2016-10-22: qty 10

## 2016-10-22 MED ORDER — METOPROLOL TARTRATE 5 MG/5ML IV SOLN
5.0000 mg | INTRAVENOUS | Status: AC | PRN
  Administered 2016-10-22 (×2): 5 mg via INTRAVENOUS

## 2018-07-14 DIAGNOSIS — F4310 Post-traumatic stress disorder, unspecified: Secondary | ICD-10-CM | POA: Insufficient documentation

## 2019-06-03 ENCOUNTER — Encounter: Payer: Self-pay | Admitting: Cardiology

## 2019-06-03 ENCOUNTER — Telehealth: Payer: Self-pay | Admitting: Radiology

## 2019-06-03 ENCOUNTER — Ambulatory Visit: Payer: Medicare Other | Admitting: Cardiology

## 2019-06-03 ENCOUNTER — Other Ambulatory Visit: Payer: Self-pay

## 2019-06-03 VITALS — BP 122/68 | HR 81 | Ht 66.0 in | Wt 137.6 lb

## 2019-06-03 DIAGNOSIS — Q2111 Secundum atrial septal defect: Secondary | ICD-10-CM

## 2019-06-03 DIAGNOSIS — Q211 Atrial septal defect: Secondary | ICD-10-CM

## 2019-06-03 DIAGNOSIS — R002 Palpitations: Secondary | ICD-10-CM

## 2019-06-03 DIAGNOSIS — E782 Mixed hyperlipidemia: Secondary | ICD-10-CM | POA: Diagnosis not present

## 2019-06-03 LAB — CBC
Hematocrit: 37.7 % (ref 37.5–51.0)
Hemoglobin: 12.6 g/dL — ABNORMAL LOW (ref 13.0–17.7)
MCH: 27.2 pg (ref 26.6–33.0)
MCHC: 33.4 g/dL (ref 31.5–35.7)
MCV: 81 fL (ref 79–97)
Platelets: 294 10*3/uL (ref 150–450)
RBC: 4.64 x10E6/uL (ref 4.14–5.80)
RDW: 14 % (ref 11.6–15.4)
WBC: 4.5 10*3/uL (ref 3.4–10.8)

## 2019-06-03 LAB — COMPREHENSIVE METABOLIC PANEL
ALT: 23 IU/L (ref 0–44)
AST: 23 IU/L (ref 0–40)
Albumin/Globulin Ratio: 2.2 (ref 1.2–2.2)
Albumin: 4.4 g/dL (ref 3.8–4.9)
Alkaline Phosphatase: 76 IU/L (ref 39–117)
BUN/Creatinine Ratio: 14 (ref 9–20)
BUN: 12 mg/dL (ref 6–24)
Bilirubin Total: 0.3 mg/dL (ref 0.0–1.2)
CO2: 24 mmol/L (ref 20–29)
Calcium: 9.9 mg/dL (ref 8.7–10.2)
Chloride: 102 mmol/L (ref 96–106)
Creatinine, Ser: 0.87 mg/dL (ref 0.76–1.27)
GFR calc Af Amer: 113 mL/min/{1.73_m2} (ref >59)
GFR calc non Af Amer: 98 mL/min/{1.73_m2} (ref >59)
Globulin, Total: 2 g/dL (ref 1.5–4.5)
Glucose: 130 mg/dL — ABNORMAL HIGH (ref 65–99)
Potassium: 4.8 mmol/L (ref 3.5–5.2)
Sodium: 140 mmol/L (ref 134–144)
Total Protein: 6.4 g/dL (ref 6.0–8.5)

## 2019-06-03 LAB — LIPID PANEL
Chol/HDL Ratio: 3.7 ratio (ref 0.0–5.0)
Cholesterol, Total: 172 mg/dL (ref 100–199)
HDL: 47 mg/dL (ref >39)
LDL Chol Calc (NIH): 80 mg/dL (ref 0–99)
Triglycerides: 279 mg/dL — ABNORMAL HIGH (ref 0–149)
VLDL Cholesterol Cal: 45 mg/dL — ABNORMAL HIGH (ref 5–40)

## 2019-06-03 LAB — TSH: TSH: 2.55 u[IU]/mL (ref 0.450–4.500)
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
## 2019-06-03 NOTE — Telephone Encounter (Signed)
Enrolled patient for a 14 day Zio monitor to be mailed to patients home.  

## 2019-06-03 NOTE — Patient Instructions (Signed)
Medication Instructions:  NONE  *If you need a refill on your cardiac medications before your next appointment, please call your pharmacy*   Lab Work: TODAY  CMP CBC TSH LIPIDS If you have labs (blood work) drawn today and your tests are completely normal, you will receive your results only by: Marland Kitchen MyChart Message (if you have MyChart) OR . A paper copy in the mail If you have any lab test that is abnormal or we need to change your treatment, we will call you to review the results.   Testing/Procedures: Your physician has requested that you have an echocardiogram. Echocardiography is a painless test that uses sound waves to create images of your heart. It provides your doctor with information about the size and shape of your heart and how well your heart's chambers and valves are working. This procedure takes approximately one hour. There are no restrictions for this procedure.  Your physician has recommended that you wear an event monitor. Event monitors are medical devices that record the heart's electrical activity. Doctors most often [place] these monitors to diagnose arrhythmias. Arrhythmias are problems with the speed or rhythm of the heartbeat. The monitor is a small, portable device. You can wear one while you do your normal daily activities. This is usually used to diagnose what is causing palpitations/syncope (passing out).  2 WEEK ZIO PATCH   Follow-Up: At [facility], you and your health needs are our priority.  As part of our continuing mission to provide you with exceptional heart care, we have created designated Provider Care Teams.  These Care Teams include your primary Cardiologist (physician) and Advanced Practice Providers (APPs -  Physician Assistants and Nurse Practitioners) who all work together to provide you with the care you need, when you need it.  We recommend signing up for the patient portal called "MyChart".  Sign up information is provided on this After Visit  Summary.  MyChart is used to connect with patients for Virtual Visits (Telemedicine).  Patients are able to view lab/test results, encounter notes, upcoming appointments, etc.  Non-urgent messages can be sent to your provider as well.   To learn more about what you can do with MyChart, go to [email or web address].    Your next appointment:   2 month(s)  The format for your next appointment:   In Person  Provider:   Tobias Alexander, MD   Other Instructions

## 2019-06-06 ENCOUNTER — Telehealth: Payer: Self-pay | Admitting: *Deleted

## 2019-06-06 MED ORDER — FISH OIL 1000 MG PO CAPS: 2000.0000 mg | ORAL_CAPSULE | Freq: Every day | ORAL | 1 refills | Status: AC

## 2019-06-06 NOTE — Telephone Encounter (Signed)
Spoke with the pt and informed him of his lab results and recommendations per Dr. Delton See, for him to start taking fish oil 2 grams po daily. Confirmed the pharmacy of choice with the pt.  Pt verbalized understanding and agrees with this plan.

## 2019-06-06 NOTE — Telephone Encounter (Signed)
-----   Message from Lars Masson, MD sent at 06/06/2019 10:48 AM EDT ----- Stable labs except for elevated triglycerides, I would add fish oil 2 g daily.

## 2019-06-25 ENCOUNTER — Other Ambulatory Visit (INDEPENDENT_AMBULATORY_CARE_PROVIDER_SITE_OTHER): Payer: Medicare Other

## 2019-06-25 DIAGNOSIS — Q2111 Secundum atrial septal defect: Secondary | ICD-10-CM

## 2019-06-25 DIAGNOSIS — R002 Palpitations: Secondary | ICD-10-CM

## 2019-06-25 DIAGNOSIS — Q211 Atrial septal defect: Secondary | ICD-10-CM

## 2019-07-05 ENCOUNTER — Other Ambulatory Visit: Payer: Self-pay

## 2019-07-05 ENCOUNTER — Ambulatory Visit (HOSPITAL_COMMUNITY): Payer: Medicare Other | Attending: Cardiovascular Disease

## 2019-07-05 ENCOUNTER — Other Ambulatory Visit (HOSPITAL_COMMUNITY): Payer: Medicare Other

## 2019-07-05 DIAGNOSIS — Q211 Atrial septal defect: Secondary | ICD-10-CM | POA: Diagnosis not present

## 2019-07-05 DIAGNOSIS — Q2111 Secundum atrial septal defect: Secondary | ICD-10-CM

## 2019-07-27 ENCOUNTER — Telehealth: Payer: Self-pay | Admitting: Cardiology

## 2019-07-27 ENCOUNTER — Other Ambulatory Visit: Payer: Self-pay | Admitting: Cardiology

## 2019-07-27 DIAGNOSIS — Q2111 Secundum atrial septal defect: Secondary | ICD-10-CM

## 2019-07-27 DIAGNOSIS — R002 Palpitations: Secondary | ICD-10-CM

## 2019-07-27 NOTE — Telephone Encounter (Signed)
Spoke with the pt and informed him of his monitor results and recommendations per Dr. Delton See.  Pt education provided on increasing his PO fluid intake and staying plenty hydrated.  Advised him that if his symptoms continue, then he should notify the office, and we will send him in low dose metoprolol 12.5 mg po bid, to his pharmacy of choice.  Pt states he is doing well at this time, and is experiencing no issues like palpitations or fluttering.  Pt states he will keep [place] posted if his symptoms reoccur, so that we can send in the metoprolol if needed.  Advised the pt to keep his appt with Dr. Delton See as scheduled for 6/23.  Pt verbalized understanding and agrees with this plan.

## 2019-07-27 NOTE — Telephone Encounter (Signed)
[facility] is returning Ray Clark's call in regards to his long term monitor results. Please advise.

## 2019-07-27 NOTE — Telephone Encounter (Signed)
-----   Message from Lars Masson, MD sent at 07/27/2019 10:51 AM EDT ----- Predominant underlying rhythm was Sinus Rhythm. 1 run of Supraventricular Tachycardia occurred lasting 4 beats.  No significant arrhythmias.  Continue current management.  Increase hydration.  If his symptoms continue I would recommend low-dose metoprolol 12.5 mg p.o. twice daily.

## 2019-08-10 ENCOUNTER — Ambulatory Visit: Payer: Medicare Other | Admitting: Cardiology

## 2019-10-31 ENCOUNTER — Ambulatory Visit: Payer: Medicare Other | Admitting: Cardiology

## 2019-12-21 ENCOUNTER — Encounter: Payer: Self-pay | Admitting: *Deleted

## 2019-12-27 ENCOUNTER — Encounter: Payer: Self-pay | Admitting: Diagnostic Neuroimaging

## 2019-12-27 ENCOUNTER — Ambulatory Visit: Payer: Medicare Other | Admitting: Diagnostic Neuroimaging

## 2019-12-27 ENCOUNTER — Telehealth: Payer: Self-pay | Admitting: Diagnostic Neuroimaging

## 2019-12-27 VITALS — BP 113/73 | HR 97 | Ht 66.0 in | Wt 139.2 lb

## 2019-12-27 DIAGNOSIS — R42 Dizziness and giddiness: Secondary | ICD-10-CM | POA: Diagnosis not present

## 2019-12-27 DIAGNOSIS — H814 Vertigo of central origin: Secondary | ICD-10-CM | POA: Diagnosis not present

## 2019-12-27 NOTE — Telephone Encounter (Signed)
[employer] medicare order sent to GI. No auth they will reach out to the patient to schedule.  
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
## 2021-01-28 ENCOUNTER — Telehealth: Payer: Self-pay | Admitting: Diagnostic Neuroimaging

## 2021-01-28 ENCOUNTER — Other Ambulatory Visit: Payer: Self-pay

## 2021-01-28 ENCOUNTER — Encounter: Payer: Self-pay | Admitting: Diagnostic Neuroimaging

## 2021-01-28 ENCOUNTER — Ambulatory Visit: Payer: Medicare Other | Admitting: Diagnostic Neuroimaging

## 2021-01-28 VITALS — BP 114/67 | HR 84 | Ht 66.0 in | Wt 141.2 lb

## 2021-01-28 DIAGNOSIS — R413 Other amnesia: Secondary | ICD-10-CM
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
## 2021-01-28 NOTE — Telephone Encounter (Signed)
[employer] medicare order sent to GI, patient is scheduled at GI for 02/25/21.
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
## 2021-01-29 ENCOUNTER — Other Ambulatory Visit (HOSPITAL_COMMUNITY): Payer: Self-pay | Admitting: Gastroenterology

## 2021-01-29 ENCOUNTER — Other Ambulatory Visit: Payer: Self-pay | Admitting: Gastroenterology

## 2021-01-29 DIAGNOSIS — R1011 Right upper quadrant pain: Secondary | ICD-10-CM

## 2021-02-25 ENCOUNTER — Telehealth: Payer: Self-pay

## 2021-02-25 ENCOUNTER — Ambulatory Visit
Admission: RE | Admit: 2021-02-25 | Discharge: 2021-02-25 | Disposition: A | Discharge status: Home / Self Care | Payer: Medicare Other | Source: Ambulatory Visit | Attending: Diagnostic Neuroimaging | Admitting: Diagnostic Neuroimaging

## 2021-02-25 DIAGNOSIS — R413 Other amnesia: Secondary | ICD-10-CM

## 2021-02-25 NOTE — Telephone Encounter (Signed)
-----   Message from Suanne Marker, MD sent at 02/25/2021  2:16 PM EST ----- Normal CT. -VRP

## 2021-02-25 NOTE — Telephone Encounter (Signed)
I called pt and we discussed results of CT scan. Pt verbalized understanding/ appreciation for the results.   Pt did want MD to be aware dizziness continue to be an issue for him. Pt will follow up with PCP on this issue since CT was normal.

## 2021-05-08 ENCOUNTER — Other Ambulatory Visit: Payer: Self-pay | Admitting: Gastroenterology

## 2021-05-08 ENCOUNTER — Other Ambulatory Visit (HOSPITAL_COMMUNITY): Payer: Self-pay | Admitting: Gastroenterology

## 2021-05-08 DIAGNOSIS — R1011 Right upper quadrant pain: Secondary | ICD-10-CM

## 2021-05-22 ENCOUNTER — Ambulatory Visit (HOSPITAL_COMMUNITY): Payer: Medicare Other

## 2021-05-22 ENCOUNTER — Encounter (HOSPITAL_COMMUNITY): Payer: Medicare Other

## 2021-05-22 ENCOUNTER — Encounter (HOSPITAL_COMMUNITY): Payer: Self-pay

## 2021-12-19 ENCOUNTER — Ambulatory Visit (INDEPENDENT_AMBULATORY_CARE_PROVIDER_SITE_OTHER): Payer: Medicare Other

## 2021-12-19 ENCOUNTER — Ambulatory Visit (HOSPITAL_COMMUNITY)
Admission: EM | Admit: 2021-12-19 | Discharge: 2021-12-19 | Disposition: A | Discharge status: Home / Self Care | Payer: Medicare Other | Attending: Family Medicine | Admitting: Family Medicine

## 2021-12-19 ENCOUNTER — Encounter (HOSPITAL_COMMUNITY): Payer: Self-pay | Admitting: *Deleted

## 2021-12-19 DIAGNOSIS — R102 Pelvic and perineal pain: Secondary | ICD-10-CM | POA: Diagnosis not present

## 2021-12-19 DIAGNOSIS — R55 Syncope and collapse: Secondary | ICD-10-CM | POA: Diagnosis present

## 2021-12-19 DIAGNOSIS — W19XXXA Unspecified fall, initial encounter: Secondary | ICD-10-CM | POA: Diagnosis not present

## 2021-12-19 LAB — COMPREHENSIVE METABOLIC PANEL
ALT: 22 U/L (ref 0–44)
AST: 26 U/L (ref 15–41)
Albumin: 4.1 g/dL (ref 3.5–5.0)
Alkaline Phosphatase: 58 U/L (ref 38–126)
Anion gap: 13 (ref 5–15)
BUN: 11 mg/dL (ref 6–20)
CO2: 24 mmol/L (ref 22–32)
Calcium: 10 mg/dL (ref 8.9–10.3)
Chloride: 104 mmol/L (ref 98–111)
Creatinine, Ser: 0.86 mg/dL (ref 0.61–1.24)
GFR, Estimated: >60 mL/min (ref >60)
Glucose, Bld: 149 mg/dL — ABNORMAL HIGH (ref 70–99)
Potassium: 5.1 mmol/L (ref 3.5–5.1)
Sodium: 141 mmol/L (ref 135–145)
Total Bilirubin: 0.6 mg/dL (ref 0.3–1.2)
Total Protein: 6.3 g/dL — ABNORMAL LOW (ref 6.5–8.1)

## 2021-12-19 LAB — CBC
HCT: 34.4 % — ABNORMAL LOW (ref 39.0–52.0)
Hemoglobin: 10 g/dL — ABNORMAL LOW (ref 13.0–17.0)
MCH: 20.4 pg — ABNORMAL LOW (ref 26.0–34.0)
MCHC: 29.1 g/dL — ABNORMAL LOW (ref 30.0–36.0)
MCV: 70.2 fL — ABNORMAL LOW (ref 80.0–100.0)
Platelets: 298 10*3/uL (ref 150–400)
RBC: 4.9 MIL/uL (ref 4.22–5.81)
RDW: 17.3 % — ABNORMAL HIGH (ref 11.5–15.5)
WBC: 5 10*3/uL (ref 4.0–10.5)
nRBC: 0 % (ref 0.0–0.2)
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
## 2021-12-19 NOTE — Discharge Instructions (Signed)
The EKG was normal.  The xray did not show any acute bony abnormality.  Call cardiology for evaluation (Dr. Ena Dawley you saw previously, or [facility])  Call your pcp for more evaluation

## 2022-01-07 ENCOUNTER — Telehealth: Payer: Self-pay | Admitting: Hematology and Oncology

## 2022-01-07 NOTE — Telephone Encounter (Signed)
Scheduled appointment per 11/21 referral. Patient is aware of appointment date and time. Patient is aware to arrive 15 mins prior to appointment time and to bring updated insurance cards. Patient is aware of location.   

## 2022-01-22 ENCOUNTER — Encounter: Payer: Self-pay | Admitting: Hematology and Oncology

## 2022-01-22 ENCOUNTER — Inpatient Hospital Stay: Payer: Medicare Other

## 2022-01-22 ENCOUNTER — Inpatient Hospital Stay: Payer: Medicare Other | Attending: Hematology and Oncology | Admitting: Hematology and Oncology

## 2022-01-22 ENCOUNTER — Other Ambulatory Visit: Payer: Self-pay

## 2022-01-22 VITALS — BP 125/70 | HR 90 | Temp 98.1°F | Resp 16 | Ht 66.0 in | Wt 134.1 lb

## 2022-01-22 DIAGNOSIS — F5089 Other specified eating disorder: Secondary | ICD-10-CM | POA: Diagnosis not present

## 2022-01-22 DIAGNOSIS — Z833 Family history of diabetes mellitus: Secondary | ICD-10-CM | POA: Diagnosis not present

## 2022-01-22 DIAGNOSIS — I10 Essential (primary) hypertension: Secondary | ICD-10-CM | POA: Diagnosis not present

## 2022-01-22 DIAGNOSIS — D509 Iron deficiency anemia, unspecified: Secondary | ICD-10-CM | POA: Diagnosis not present

## 2022-01-22 DIAGNOSIS — R109 Unspecified abdominal pain: Secondary | ICD-10-CM | POA: Insufficient documentation

## 2022-01-22 DIAGNOSIS — E119 Type 2 diabetes mellitus without complications: Secondary | ICD-10-CM | POA: Diagnosis not present

## 2022-01-22 DIAGNOSIS — Z8249 Family history of ischemic heart disease and other diseases of the circulatory system: Secondary | ICD-10-CM | POA: Diagnosis not present

## 2022-01-22 DIAGNOSIS — Z9049 Acquired absence of other specified parts of digestive tract: Secondary | ICD-10-CM | POA: Diagnosis not present

## 2022-01-22 DIAGNOSIS — Z79899 Other long term (current) drug therapy: Secondary | ICD-10-CM | POA: Diagnosis not present

## 2022-01-22 DIAGNOSIS — F4310 Post-traumatic stress disorder, unspecified: Secondary | ICD-10-CM | POA: Insufficient documentation

## 2022-01-22 DIAGNOSIS — Z86711 Personal history of pulmonary embolism: Secondary | ICD-10-CM | POA: Diagnosis not present

## 2022-01-22 DIAGNOSIS — R55 Syncope and collapse: Secondary | ICD-10-CM | POA: Insufficient documentation
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
## 2022-01-28 ENCOUNTER — Ambulatory Visit: Payer: Medicare Other | Attending: Cardiovascular Disease | Admitting: Cardiovascular Disease

## 2022-01-28 ENCOUNTER — Encounter: Payer: Self-pay | Admitting: Cardiovascular Disease

## 2022-01-28 ENCOUNTER — Ambulatory Visit (INDEPENDENT_AMBULATORY_CARE_PROVIDER_SITE_OTHER): Payer: Medicare Other

## 2022-01-28 VITALS — BP 120/62 | HR 84 | Ht 66.0 in | Wt 134.6 lb

## 2022-01-28 DIAGNOSIS — Q2111 Secundum atrial septal defect: Secondary | ICD-10-CM

## 2022-01-28 DIAGNOSIS — R55 Syncope and collapse: Secondary | ICD-10-CM
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
## 2022-01-28 NOTE — Progress Notes (Unsigned)
Applied a 14 day Zio XT monitor to patient in the office ?

## 2022-01-28 NOTE — Patient Instructions (Signed)
Medication Instructions:  Your physician recommends that you continue on your current medications as directed. Please refer to the Current Medication list given to you today.  *If you need a refill on your cardiac medications before your next appointment, please call your pharmacy*   Lab Work: NONE If you have labs (blood work) drawn today and your tests are completely normal, you will receive your results only by: MyChart Message (if you have MyChart) OR A paper copy in the mail If you have any lab test that is abnormal or we need to change your treatment, we will call you to review the results.   Testing/Procedures: 14-day Zio Your physician has recommended that you wear an event monitor. Event monitors are medical devices that record the heart's electrical activity. Doctors most often [place] these monitors to diagnose arrhythmias. Arrhythmias are problems with the speed or rhythm of the heartbeat. The monitor is a small, portable device. You can wear one while you do your normal daily activities. This is usually used to diagnose what is causing palpitations/syncope (passing out).  ECHO Your physician has requested that you have an echocardiogram. Echocardiography is a painless test that uses sound waves to create images of your heart. It provides your doctor with information about the size and shape of your heart and how well your heart's chambers and valves are working. This procedure takes approximately one hour. There are no restrictions for this procedure. Please do NOT wear cologne, perfume, aftershave, or lotions (deodorant is allowed). Please arrive 15 minutes prior to your appointment time.   Follow-Up: At [facility], you and your health needs are our priority.  As part of our continuing mission to provide you with exceptional heart care, we have created designated Provider Care Teams.  These Care Teams include your primary Cardiologist (physician) and Advanced Practice  Providers (APPs -  Physician Assistants and Nurse Practitioners) who all work together to provide you with the care you need, when you need it.   Your next appointment:   1 year(s)  The format for your next appointment:   In Person  Provider:   Tonny Bollman, MD  Other Instructions **Encourage liberality in salt intake via diet**  Important Information About Sugar

## 2022-01-29 ENCOUNTER — Encounter: Payer: Self-pay | Admitting: Physician Assistant

## 2022-01-29 ENCOUNTER — Inpatient Hospital Stay: Payer: Medicare Other

## 2022-01-29 ENCOUNTER — Encounter: Payer: Self-pay | Admitting: Cardiovascular Disease

## 2022-01-29 VITALS — BP 120/65 | HR 80 | Temp 98.2°F | Resp 16

## 2022-01-29 DIAGNOSIS — D509 Iron deficiency anemia, unspecified: Secondary | ICD-10-CM

## 2022-01-29 MED ORDER — SODIUM CHLORIDE 0.9 % IV SOLN
200.0000 mg | Freq: Once | INTRAVENOUS | Status: AC
  Administered 2022-01-29: 200 mg via INTRAVENOUS
  Filled 2022-01-29: qty 200

## 2022-01-29 MED ORDER — SODIUM CHLORIDE 0.9 % IV SOLN: Freq: Once | INTRAVENOUS | Status: AC

## 2022-01-29 NOTE — Patient Instructions (Signed)

## 2022-01-29 NOTE — Progress Notes (Signed)
At the end of 30 minute observation, patient complained of slight "tingling" in his right leg. Jae Dire PA called to chairside to assess. Patient reported resolution of symptom within a few minutes. No orders received. Patient stable upon discharge.

## 2022-01-29 NOTE — Progress Notes (Signed)
Ray Clark is a 56 y.o. male with pertinent history of iron deficiency anemia that I examined in the infusion center today with chief complaint of tingling in his left leg.  Please see previous heme-onc notes for full medical history.  Patient is here today receiving first time IV iron infusion with Venofer 200 mg.  When his observation period had finished he reported tingling in his right leg to the RN.  I examined the patient and symptoms had already resolved within minutes.  He has a normal neuroexam.  Patient ambulated without difficulty and has normal gait.  Patient admitted to wearing a new pair of boots today.  He had been sitting in the exam chair for over an hour without much movement.  He has no other exam findings or symptoms to suggest allergic reaction or even anaphylaxis.  I engaged in shared decision-making with patient and he does not feel any medications are necessary at this time in case this were to be an allergic reaction.  I will make oncologist aware of his symptoms although still feel this was more likely positional as tingling resolved once he repositions himself in the chair.  Patient discharged in stable condition.  I discussed strict ED precautions with patient should anything new develop or worsen. Patient and spouse are in agreement with this plan.

## 2022-02-03 ENCOUNTER — Inpatient Hospital Stay: Payer: Medicare Other

## 2022-02-05 ENCOUNTER — Inpatient Hospital Stay: Payer: Medicare Other

## 2022-02-05 ENCOUNTER — Other Ambulatory Visit: Payer: Self-pay

## 2022-02-05 VITALS — BP 117/79 | HR 75 | Temp 98.1°F | Resp 17

## 2022-02-05 DIAGNOSIS — D509 Iron deficiency anemia, unspecified: Secondary | ICD-10-CM | POA: Diagnosis not present

## 2022-02-05 MED ORDER — SODIUM CHLORIDE 0.9 % IV SOLN: Freq: Once | INTRAVENOUS | Status: AC

## 2022-02-05 MED ORDER — SODIUM CHLORIDE 0.9 % IV SOLN
200.0000 mg | Freq: Once | INTRAVENOUS | Status: AC
  Administered 2022-02-05: 200 mg via INTRAVENOUS
  Filled 2022-02-05: qty 200

## 2022-02-05 NOTE — Patient Instructions (Signed)

## 2022-02-07 ENCOUNTER — Inpatient Hospital Stay: Payer: Medicare Other

## 2022-02-13 ENCOUNTER — Inpatient Hospital Stay: Payer: Medicare Other

## 2022-02-13 VITALS — BP 123/77 | HR 87 | Temp 98.3°F | Resp 18

## 2022-02-13 DIAGNOSIS — D509 Iron deficiency anemia, unspecified: Secondary | ICD-10-CM

## 2022-02-13 MED ORDER — SODIUM CHLORIDE 0.9 % IV SOLN: Freq: Once | INTRAVENOUS | Status: AC

## 2022-02-13 MED ORDER — SODIUM CHLORIDE 0.9 % IV SOLN
200.0000 mg | Freq: Once | INTRAVENOUS | Status: AC
  Administered 2022-02-13: 200 mg via INTRAVENOUS
  Filled 2022-02-13: qty 200

## 2022-02-13 NOTE — Patient Instructions (Signed)

## 2022-02-19 ENCOUNTER — Other Ambulatory Visit: Payer: Self-pay

## 2022-02-19 ENCOUNTER — Inpatient Hospital Stay: Payer: Medicare Other | Attending: Hematology and Oncology

## 2022-02-19 VITALS — BP 102/68 | HR 81 | Temp 98.0°F | Resp 18

## 2022-02-19 DIAGNOSIS — Z8249 Family history of ischemic heart disease and other diseases of the circulatory system: Secondary | ICD-10-CM | POA: Diagnosis not present

## 2022-02-19 DIAGNOSIS — Z833 Family history of diabetes mellitus: Secondary | ICD-10-CM | POA: Diagnosis not present

## 2022-02-19 DIAGNOSIS — D509 Iron deficiency anemia, unspecified: Secondary | ICD-10-CM | POA: Insufficient documentation

## 2022-02-19 DIAGNOSIS — E119 Type 2 diabetes mellitus without complications: Secondary | ICD-10-CM | POA: Insufficient documentation

## 2022-02-19 DIAGNOSIS — I10 Essential (primary) hypertension: Secondary | ICD-10-CM | POA: Diagnosis not present

## 2022-02-19 DIAGNOSIS — Z79899 Other long term (current) drug therapy: Secondary | ICD-10-CM | POA: Diagnosis not present

## 2022-02-19 MED ORDER — SODIUM CHLORIDE 0.9 % IV SOLN
200.0000 mg | Freq: Once | INTRAVENOUS | Status: AC
  Administered 2022-02-19: 200 mg via INTRAVENOUS
  Filled 2022-02-19: qty 200

## 2022-02-19 MED ORDER — SODIUM CHLORIDE 0.9 % IV SOLN: Freq: Once | INTRAVENOUS | Status: AC

## 2022-02-19 NOTE — Patient Instructions (Signed)

## 2022-02-25 ENCOUNTER — Other Ambulatory Visit (HOSPITAL_COMMUNITY): Payer: Medicare Other

## 2022-02-26 ENCOUNTER — Inpatient Hospital Stay: Payer: Medicare Other

## 2022-02-28 ENCOUNTER — Encounter: Payer: Self-pay | Admitting: Hematology and Oncology

## 2022-03-04 ENCOUNTER — Inpatient Hospital Stay: Payer: Medicare Other

## 2022-03-04 ENCOUNTER — Other Ambulatory Visit: Payer: Self-pay

## 2022-03-04 VITALS — BP 118/71 | HR 76 | Temp 98.0°F | Resp 16

## 2022-03-04 DIAGNOSIS — D509 Iron deficiency anemia, unspecified: Secondary | ICD-10-CM | POA: Diagnosis not present

## 2022-03-04 MED ORDER — SODIUM CHLORIDE 0.9 % IV SOLN: Freq: Once | INTRAVENOUS | Status: AC

## 2022-03-04 MED ORDER — SODIUM CHLORIDE 0.9 % IV SOLN
200.0000 mg | Freq: Once | INTRAVENOUS | Status: AC
  Administered 2022-03-04: 200 mg via INTRAVENOUS
  Filled 2022-03-04: qty 200

## 2022-03-04 NOTE — Patient Instructions (Signed)

## 2022-03-07 ENCOUNTER — Inpatient Hospital Stay: Payer: Medicare Other

## 2022-03-12 ENCOUNTER — Ambulatory Visit (HOSPITAL_COMMUNITY): Payer: Medicare Other | Attending: Cardiovascular Disease

## 2022-03-12 DIAGNOSIS — R55 Syncope and collapse: Secondary | ICD-10-CM

## 2022-03-12 DIAGNOSIS — Q2111 Secundum atrial septal defect: Secondary | ICD-10-CM | POA: Diagnosis not present

## 2022-03-12 LAB — ECHOCARDIOGRAM COMPLETE
Area-P 1/2: 5.13 cm2
S' Lateral: 2.1 cm

## 2022-04-22 ENCOUNTER — Other Ambulatory Visit: Payer: Self-pay

## 2022-04-22 DIAGNOSIS — D509 Iron deficiency anemia, unspecified: Secondary | ICD-10-CM

## 2022-04-23 ENCOUNTER — Inpatient Hospital Stay: Payer: Medicare Other | Attending: Hematology and Oncology

## 2022-04-23 ENCOUNTER — Inpatient Hospital Stay (HOSPITAL_BASED_OUTPATIENT_CLINIC_OR_DEPARTMENT_OTHER): Payer: Medicare Other | Admitting: Hematology and Oncology

## 2022-04-23 VITALS — BP 112/73 | HR 92 | Temp 97.9°F | Resp 18 | Ht 66.0 in | Wt 135.1 lb

## 2022-04-23 DIAGNOSIS — Z79899 Other long term (current) drug therapy: Secondary | ICD-10-CM | POA: Insufficient documentation

## 2022-04-23 DIAGNOSIS — E119 Type 2 diabetes mellitus without complications: Secondary | ICD-10-CM | POA: Insufficient documentation

## 2022-04-23 DIAGNOSIS — Z86711 Personal history of pulmonary embolism: Secondary | ICD-10-CM | POA: Diagnosis not present

## 2022-04-23 DIAGNOSIS — Z9049 Acquired absence of other specified parts of digestive tract: Secondary | ICD-10-CM | POA: Insufficient documentation

## 2022-04-23 DIAGNOSIS — I10 Essential (primary) hypertension: Secondary | ICD-10-CM | POA: Insufficient documentation

## 2022-04-23 DIAGNOSIS — Z8249 Family history of ischemic heart disease and other diseases of the circulatory system: Secondary | ICD-10-CM | POA: Diagnosis not present

## 2022-04-23 DIAGNOSIS — Z833 Family history of diabetes mellitus: Secondary | ICD-10-CM | POA: Diagnosis not present

## 2022-04-23 DIAGNOSIS — D509 Iron deficiency anemia, unspecified: Secondary | ICD-10-CM | POA: Insufficient documentation

## 2022-04-23 DIAGNOSIS — G4733 Obstructive sleep apnea (adult) (pediatric): Secondary | ICD-10-CM | POA: Insufficient documentation

## 2022-04-23 DIAGNOSIS — F4310 Post-traumatic stress disorder, unspecified: Secondary | ICD-10-CM | POA: Insufficient documentation

## 2022-04-23 LAB — CBC WITH DIFFERENTIAL (CANCER CENTER ONLY)
Abs Immature Granulocytes: 0.01 10*3/uL (ref 0.00–0.07)
Basophils Absolute: 0.1 10*3/uL (ref 0.0–0.1)
Basophils Relative: 1 %
Eosinophils Absolute: 0.2 10*3/uL (ref 0.0–0.5)
Eosinophils Relative: 3 %
HCT: 43.9 % (ref 39.0–52.0)
Hemoglobin: 15.5 g/dL (ref 13.0–17.0)
Immature Granulocytes: 0 %
Lymphocytes Relative: 20 %
Lymphs Abs: 1.2 10*3/uL (ref 0.7–4.0)
MCH: 29.1 pg (ref 26.0–34.0)
MCHC: 35.3 g/dL (ref 30.0–36.0)
MCV: 82.5 fL (ref 80.0–100.0)
Monocytes Absolute: 0.6 10*3/uL (ref 0.1–1.0)
Monocytes Relative: 10 %
Neutro Abs: 4 10*3/uL (ref 1.7–7.7)
Neutrophils Relative %: 66 %
Platelet Count: 238 10*3/uL (ref 150–400)
RBC: 5.32 MIL/uL (ref 4.22–5.81)
RDW: 17.1 % — ABNORMAL HIGH (ref 11.5–15.5)
WBC Count: 6 10*3/uL (ref 4.0–10.5)
nRBC: 0 % (ref 0.0–0.2)

## 2022-04-23 LAB — IRON AND IRON BINDING CAPACITY (CC-WL,HP ONLY)
Iron: 87 ug/dL (ref 45–182)
Saturation Ratios: 19 % (ref 17.9–39.5)
TIBC: 466 ug/dL — ABNORMAL HIGH (ref 250–450)
UIBC: 379 ug/dL

## 2022-04-23 LAB — CMP (CANCER CENTER ONLY)
ALT: 27 U/L (ref 0–44)
AST: 24 U/L (ref 15–41)
Albumin: 4.7 g/dL (ref 3.5–5.0)
Alkaline Phosphatase: 71 U/L (ref 38–126)
Anion gap: 4 — ABNORMAL LOW (ref 5–15)
BUN: 12 mg/dL (ref 6–20)
CO2: 28 mmol/L (ref 22–32)
Calcium: 9.3 mg/dL (ref 8.9–10.3)
Chloride: 105 mmol/L (ref 98–111)
Creatinine: 0.82 mg/dL (ref 0.61–1.24)
GFR, Estimated: >60 mL/min (ref >60)
Glucose, Bld: 152 mg/dL — ABNORMAL HIGH (ref 70–99)
Potassium: 4 mmol/L (ref 3.5–5.1)
Sodium: 137 mmol/L (ref 135–145)
Total Bilirubin: 0.5 mg/dL (ref 0.3–1.2)
Total Protein: 7.2 g/dL (ref 6.5–8.1)

## 2022-04-23 LAB — SAMPLE TO BLOOD BANK
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
## 2022-04-24 LAB — FERRITIN: Ferritin: 75 ng/mL (ref 24–336)

## 2022-05-26 ENCOUNTER — Ambulatory Visit: Payer: Medicare Other | Admitting: Cardiovascular Disease

## 2022-06-23 ENCOUNTER — Telehealth: Payer: Self-pay | Admitting: Cardiovascular Disease

## 2022-06-23 NOTE — Telephone Encounter (Signed)
Pt c/o of Chest Pain: STAT if CP now or developed within 24 hours  1. Are you having CP right now?  No   2. Are you experiencing any other symptoms (ex. SOB, nausea, vomiting, sweating)?  No   3. How long have you been experiencing CP? Right sided CP whenever he lifts something or stretches for the past 2 weeks   4. Is your CP continuous or coming and going?  Coming and going   5. Have you taken Nitroglycerin?  No  ?

## 2022-06-24 NOTE — Telephone Encounter (Signed)
Reached out to patient who confirmed that pain is only provoked with movement. He denies having tried any OTC pain medications or heat/ice compresses. Advised that he try tylenol/ibu and hot compress for relief. No other cardiac symptoms. Recent ECHO and monitor were both good. States he will try and call us back if needed.

## 2022-08-08 ENCOUNTER — Other Ambulatory Visit: Payer: Self-pay | Admitting: General Surgery

## 2022-08-08 DIAGNOSIS — G8929 Other chronic pain: Secondary | ICD-10-CM

## 2023-01-26 ENCOUNTER — Encounter: Payer: Self-pay | Admitting: Cardiovascular Disease

## 2023-01-26 ENCOUNTER — Ambulatory Visit: Payer: Medicare Other | Attending: Cardiovascular Disease | Admitting: Cardiovascular Disease

## 2023-01-26 VITALS — BP 110/66 | HR 89 | Ht 66.0 in | Wt 136.0 lb

## 2023-01-26 DIAGNOSIS — R55 Syncope and collapse: Secondary | ICD-10-CM

## 2023-01-26 DIAGNOSIS — Q2111 Secundum atrial septal defect: Secondary | ICD-10-CM | POA: Diagnosis not present

## 2023-01-26 NOTE — Patient Instructions (Signed)

## 2023-01-26 NOTE — Assessment & Plan Note (Signed)
Most recent echocardiogram reviewed with complete closure, device in appropriate position, normal LV and RV function and no valvular disease.
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
## 2023-01-26 NOTE — Assessment & Plan Note (Signed)
No recurrence.  Reviewed last year studies including his event monitor and echocardiogram, both with reassuring findings.

## 2023-04-28 ENCOUNTER — Ambulatory Visit: Payer: Medicare Other | Admitting: Hematology and Oncology

## 2023-05-09 ENCOUNTER — Encounter: Payer: Self-pay | Admitting: Hematology and Oncology

## 2023-06-08 ENCOUNTER — Telehealth (HOSPITAL_COMMUNITY): Payer: Self-pay | Admitting: Psychiatry

## 2023-06-22 ENCOUNTER — Encounter (HOSPITAL_COMMUNITY): Payer: Self-pay | Admitting: Psychiatry

## 2023-06-22 ENCOUNTER — Telehealth (HOSPITAL_BASED_OUTPATIENT_CLINIC_OR_DEPARTMENT_OTHER): Admitting: Psychiatry

## 2023-06-22 VITALS — Wt 134.0 lb

## 2023-06-22 DIAGNOSIS — F331 Major depressive disorder, recurrent, moderate: Secondary | ICD-10-CM | POA: Diagnosis not present

## 2023-06-22 DIAGNOSIS — R413 Other amnesia: Secondary | ICD-10-CM

## 2023-06-22 DIAGNOSIS — F4310 Post-traumatic stress disorder, unspecified: Secondary | ICD-10-CM | POA: Diagnosis not present

## 2023-06-22 MED ORDER — MIRTAZAPINE 15 MG PO TBDP: 15.0000 mg | ORAL_TABLET | Freq: Every day | ORAL | 0 refills | Status: DC
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
## 2023-07-14 ENCOUNTER — Telehealth: Payer: Self-pay

## 2023-07-14 NOTE — Telephone Encounter (Signed)
 Verbally confirmed appt for 5/28

## 2023-07-15 ENCOUNTER — Inpatient Hospital Stay

## 2023-07-15 ENCOUNTER — Inpatient Hospital Stay: Attending: Hematology and Oncology | Admitting: Hematology and Oncology

## 2023-07-15 VITALS — BP 116/67 | HR 87 | Temp 98.6°F | Resp 18 | Wt 131.2 lb

## 2023-07-15 DIAGNOSIS — K5900 Constipation, unspecified: Secondary | ICD-10-CM | POA: Insufficient documentation

## 2023-07-15 DIAGNOSIS — F4310 Post-traumatic stress disorder, unspecified: Secondary | ICD-10-CM | POA: Insufficient documentation

## 2023-07-15 DIAGNOSIS — Z86711 Personal history of pulmonary embolism: Secondary | ICD-10-CM | POA: Insufficient documentation

## 2023-07-15 DIAGNOSIS — R634 Abnormal weight loss: Secondary | ICD-10-CM | POA: Insufficient documentation

## 2023-07-15 DIAGNOSIS — Z833 Family history of diabetes mellitus: Secondary | ICD-10-CM | POA: Diagnosis not present

## 2023-07-15 DIAGNOSIS — E1143 Type 2 diabetes mellitus with diabetic autonomic (poly)neuropathy: Secondary | ICD-10-CM | POA: Insufficient documentation

## 2023-07-15 DIAGNOSIS — Z79899 Other long term (current) drug therapy: Secondary | ICD-10-CM | POA: Diagnosis not present

## 2023-07-15 DIAGNOSIS — Z9049 Acquired absence of other specified parts of digestive tract: Secondary | ICD-10-CM | POA: Insufficient documentation

## 2023-07-15 DIAGNOSIS — D509 Iron deficiency anemia, unspecified: Secondary | ICD-10-CM | POA: Diagnosis not present

## 2023-07-15 DIAGNOSIS — Z8249 Family history of ischemic heart disease and other diseases of the circulatory system: Secondary | ICD-10-CM | POA: Diagnosis not present

## 2023-07-15 DIAGNOSIS — R42 Dizziness and giddiness: Secondary | ICD-10-CM | POA: Diagnosis not present

## 2023-07-15 DIAGNOSIS — G909 Disorder of the autonomic nervous system, unspecified: Secondary | ICD-10-CM | POA: Insufficient documentation

## 2023-07-15 LAB — CBC WITH DIFFERENTIAL/PLATELET
Abs Immature Granulocytes: 0.01 10*3/uL (ref 0.00–0.07)
Basophils Absolute: 0.1 10*3/uL (ref 0.0–0.1)
Basophils Relative: 1 %
Eosinophils Absolute: 0.2 10*3/uL (ref 0.0–0.5)
Eosinophils Relative: 2 %
HCT: 42.8 % (ref 39.0–52.0)
Hemoglobin: 15 g/dL (ref 13.0–17.0)
Immature Granulocytes: 0 %
Lymphocytes Relative: 22 %
Lymphs Abs: 1.4 10*3/uL (ref 0.7–4.0)
MCH: 30.5 pg (ref 26.0–34.0)
MCHC: 35 g/dL (ref 30.0–36.0)
MCV: 87 fL (ref 80.0–100.0)
Monocytes Absolute: 0.6 10*3/uL (ref 0.1–1.0)
Monocytes Relative: 9 %
Neutro Abs: 4.3 10*3/uL (ref 1.7–7.7)
Neutrophils Relative %: 66 %
Platelets: 259 10*3/uL (ref 150–400)
RBC: 4.92 MIL/uL (ref 4.22–5.81)
RDW: 12.6 % (ref 11.5–15.5)
WBC: 6.5 10*3/uL (ref 4.0–10.5)
nRBC: 0 % (ref 0.0–0.2)

## 2023-07-15 LAB — IRON AND IRON BINDING CAPACITY (CC-WL,HP ONLY)
Iron: 78 ug/dL (ref 45–182)
Saturation Ratios: 17 % — ABNORMAL LOW (ref 17.9–39.5)
TIBC: 470 ug/dL — ABNORMAL HIGH (ref 250–450)
UIBC: 392 ug/dL
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
## 2023-07-16 ENCOUNTER — Ambulatory Visit: Payer: Self-pay | Admitting: Hematology and Oncology

## 2023-07-16 DIAGNOSIS — D509 Iron deficiency anemia, unspecified: Secondary | ICD-10-CM

## 2023-07-16 LAB — FERRITIN: Ferritin: 35 ng/mL (ref 24–336)

## 2023-07-21 ENCOUNTER — Telehealth (HOSPITAL_COMMUNITY): Admitting: Psychiatry

## 2023-07-29 ENCOUNTER — Ambulatory Visit: Payer: Medicare Other | Admitting: Diagnostic Neuroimaging

## 2023-07-29 ENCOUNTER — Encounter: Payer: Self-pay | Admitting: Diagnostic Neuroimaging

## 2023-07-29 ENCOUNTER — Encounter: Payer: Self-pay | Admitting: Hematology and Oncology

## 2023-07-29 DIAGNOSIS — R269 Unspecified abnormalities of gait and mobility: Secondary | ICD-10-CM | POA: Diagnosis not present
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
## 2023-08-05 ENCOUNTER — Encounter: Payer: Self-pay | Admitting: Hematology and Oncology

## 2023-08-05 NOTE — Telephone Encounter (Signed)
 Called patient back to share lab results and to let him know we would schedule him in 3 months for repeat labs. Orders entered in his chart. Advised that scheduling would call him back with an appointment time. Terrel Ferries, RN

## 2023-08-05 NOTE — Telephone Encounter (Signed)
-----   Message from [facility] sent at 08/01/2023  9:47 AM EDT ----- He is not anemia, his ferritin is lower but still quite in normal range, so we can wait on the Iron  infusion, can he come in 3 months for labs. Can you make sure lab appt and lab orders are in place,  followed by a telephone visit with me. ----- Message ----- From: Paullette Boston, RN Sent: 07/31/2023   2:53 PM EDT To: Murleen Arms, MD  Called pt - he states because of his digestive problem the oral iron  is not working - he is requesting IV replacement.  Thank you Val RN ----- Message ----- From: Murleen Arms, MD Sent: 07/16/2023   4:30 PM EDT To: Paullette Boston, RN  Its probably a good idea to resume oral ferrous sulfate 65 mg every other day, ferritin dropping, can u let him know.

## 2023-08-31 ENCOUNTER — Encounter (HOSPITAL_COMMUNITY): Payer: Self-pay | Admitting: Psychiatry

## 2023-08-31 ENCOUNTER — Telehealth (HOSPITAL_COMMUNITY): Payer: Self-pay | Admitting: Psychiatry

## 2023-08-31 ENCOUNTER — Telehealth (HOSPITAL_BASED_OUTPATIENT_CLINIC_OR_DEPARTMENT_OTHER): Admitting: Psychiatry

## 2023-08-31 VITALS — Wt 131.0 lb

## 2023-08-31 DIAGNOSIS — R413 Other amnesia: Secondary | ICD-10-CM | POA: Diagnosis not present

## 2023-08-31 DIAGNOSIS — F4310 Post-traumatic stress disorder, unspecified: Secondary | ICD-10-CM | POA: Diagnosis not present

## 2023-08-31 DIAGNOSIS — F331 Major depressive disorder, recurrent, moderate: Secondary | ICD-10-CM

## 2023-08-31 MED ORDER — TRAZODONE HCL 100 MG PO TABS: 50.0000 mg | ORAL_TABLET | Freq: Every evening | ORAL | 0 refills | Status: DC | PRN

## 2023-08-31 NOTE — Telephone Encounter (Signed)
 D:  Dr. Curry requested case manager to call pt with EMDR therapists names and phone numbers.  A:  Placed call to pt to provide him with a list of therapists.  He asked if cm could text him the information.  Case manager stated she could email the information to him.  CM sent info to [email or web address]; also encouraged pt to contact his insurance company if the therapists don't work out for any reason.  Inform Dr. Curry.  R:  Pt receptive.
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
## 2023-09-28 ENCOUNTER — Telehealth (HOSPITAL_COMMUNITY): Admitting: Psychiatry

## 2023-09-28 ENCOUNTER — Encounter (HOSPITAL_COMMUNITY): Payer: Self-pay

## 2023-10-09 NOTE — Progress Notes (Signed)
 [facility]  - FAMILY MEDICINE [employer] Medicare Wellness Visit Type:: Subsequent Annual Wellness Visit  Name: Ray Clark Date of Birth: 1965-05-03 Age: 58 y.o. MRN: [id number] Visit Date: 10/09/2023  History obtained from: patient  Living Arrangements/Support System/Health Assessment/Pain/Stress Marital status: married Number of children: 0 Occupation: disabled Living arrangements: lives with spouse/significant other Does the patient have a support system (family, friend, church, Conservation officer, nature, etc)?: Yes   Do you have any dental concerns?: No In the past month, have you experienced a change in your bladder control?: No   Do you have any difficulty obtaining your medications?: No   Do you have trouble consistently taking or remembering to take all of your medications as prescribed?: No Patient rates overall stress level as: (!) A lot Does stress affect daily life?: (!) Yes Typical amount of pain: (!) a lot Does pain affect daily life?: (!) Yes Are you currently prescribed opioids?: No                Depression Screening  Behavioral Health Screening  Patient Health Questionnaire-2 Score: 2 (10/09/2023  9:15 AM)  Patient Health Questionnaire-9 Score: 9 (10/09/2023  9:15 AM)    Interpretation =  PHQ-2 Interpretation: Negative (None-minimal Depression Severity) (10/09/2023  9:15 AM)  PHQ-9 Interpretation: Positive (Mild Depression Severity) (10/09/2023  9:15 AM)   PHQ Question # 9 Thoughts that you would be better off dead or hurting yourself in some way: Not at all (10/09/2023  9:15 AM)      Patient's Depression screening/score is Positive   Depression Plan: Continue current psychiatric treatment plan, seeing behavioral health    Social History (Tobacco/Drugs/Sexual Activity) Handy reports that he has never smoked. He has never been exposed to tobacco smoke. He has never used smokeless tobacco. Tobacco Use?: No How many times in  the past year have you used a recreational drug or used a prescription medication for nonmedical reasons?: None Risk factors for sexually transmitted infections (i.e., multiple sexual partners): No Are you bothered by sexual problems?: No  Alcohol Screening How often do you have a drink containing alcohol?: Never How many standard drinks containing alcohol do you have on a typical day?: Never, 1 or 2 drinks How often do you have six or more drinks on one occasion?: Never Audit-C Score: 0  Physical Activity Regular exercise?: Yes Exercise frequency (times per week): 3 Exercise intensity: light (like slow walking)  Diet How many meals a day?: 2 Eats fruit and vegetables daily?: Yes Most meals are obtained by: preparing their own meals, having others provide food  Home and Transportation Safety All rugs have non-skid backing?: Yes All stairs or steps have railings?: Yes Grab bars in the bathtub or shower?: Yes Have non-skid surface in bathtub or shower?: Yes Good home lighting?: Yes Regular seat belt use?: Yes      Activities of Daily Living Feed self?: Yes Bathe self?: Yes Dress self?: Yes Use toilet without assistance?: Yes Walk without assistance?: Yes    Instrumental Activities of Daily Living Manage finances?: Yes Shop for themselves?: Yes Prepare meals?: Yes Use the telephone?: Yes Manage medications?: Yes   Performs basic housework/laundry?: Yes Drives?: Yes Primary transportation is: driving  Hearing Concerns about hearing?: No Uses hearing aids?: No Hear whispered voice? (Observed): Yes  Vision Concerns about vision?: No Vision exam performed?: Yes  Fall Risk Is the patient ambulatory?: Yes One or more falls in the last year:: No Feels unsteady when walking:: No  Cognitive  Assessment Has a diagnosis of dementia or cognitive impairment?: Yes                Advance Directives Living will?: (!) No Advance directive information provided to  patient: N/A Healthcare POA?: (!) No       Who is your in case of emergency contact?: Aditi Kashyapagnik Relationship to patient: Spouse Emergency contact's phone number: [phone]   Other History I reviewed and updated the following risk factors and conditions as appropriate: Reviewed/Updated: Problem List, Medical History, Surgical History, Family History, Medications, Allergies Reviewed/Updated: Vital Signs (height, weight, and BP), Immunizations, Health Maintenance Patient Care Team Updated: Done  Vital Signs BP 126/70 (BP Location: Left arm, Patient Position: Sitting)   Pulse 70   Temp 97.5 F (36.4 C) (Temporal)   Resp 16   Ht 1.657 m (5' 5.25)   Wt 59.9 kg (132 lb)   SpO2 98%   BMI 21.80 kg/m   Screening and Immunizations Health Maintenance Status       Date Due Completion Dates   Diabetes:  Quantitative uACR for Kidney Evaluation Never done ---   HIV Screening Never done ---   Hepatitis C Screening Never done ---   DTaP/Tdap/Td Vaccines (1 - Tdap) Never done ---   Hepatitis B Vaccines (1 of 3 - 19+ 3-dose series) Never done ---   Pneumococcal Vaccine for Ages 50+ (2 of 2 - PCV) 08/30/2013 08/30/2012   ZOSTER VACCINE (1 of 2) Never done ---   COVID-19 Vaccine (5 - 2024-25 season) 10/19/2022 09/19/2020, 12/06/2019   Diabetes:  eGFR for Kidney Evaluation 10/02/2023 10/02/2022, 10/15/2021   Influenza Vaccine (1) 09/18/2023 01/21/2021, 01/09/2020   Colorectal Cancer Screening 06/26/2024 06/26/2021, 06/26/2021   Depression Monitoring 04/10/2024 10/09/2023   Diabetes: Hemoglobin A1C 05/19/2024 05/20/2023, 05/20/2023   Diabetes: Retinopathy Screening Combo 09/09/2024 09/10/2023, 09/10/2023   Comprehensive Annual Visit 10/08/2024 10/09/2023, 10/09/2023   Medicare Annual Wellness (AWV) Subsequent Visits 10/08/2024 10/09/2023, 10/09/2023   Diabetes: Foot Exam 10/08/2024 10/09/2023, 10/02/2022   Comment on 10/15/2021: See Legacy System   Adult RSV (60+ Years or Pregnancy) (1 - 1-dose 75+  series) 09/30/2039 ---       Immunization History  Administered Date(s) Administered  . Influenza, Injectable, Quadrivalent, Preservative Free 11/15/2018, 01/09/2020, 01/21/2021  . Influenza, Unspecified 11/02/2013  . Pfizer SARS-CoV-2 Primary Series 12+ yrs 04/25/2019, 05/16/2019, 12/06/2019, 09/19/2020  . Pneumococcal Polysaccharide Vaccine, 23 Valent (PNEUMOVAX-23) 2Y+ 08/30/2012    Assessment/Plan: Subsequent Annual Wellness Visit: The topics above were reviewed with the patient.  Healthy lifestyle principles reviewed.  Recommendations provided when indicated.  Follow up 1 year for next wellness visit.  Orders Placed This Encounter  Procedures  . CBC with Differential  . Comprehensive Metabolic Panel  . Lipid Panel  . Albumin, Random Urine  . CBC with Differential  . PSA, Total (Screen)  . Hemoglobin A1C With Estimated Average Glucose    New Medications Ordered This Visit  Medications  . traZODone  (DESYREL ) 100 mg tablet    Sig: Take 50-100 mg by mouth.  . insulin  glargine U-300 (Toujeo SoloStar U-300 Insulin ) 300 unit/mL (1.5 mL) pen pen    Sig: Inject 25 Units under the skin 2 (two) times a day.    Dispense:  18 mL    Refill:  1  . sildenafiL (VIAGRA) 50 mg tablet    Sig: Take 1 tablet (50 mg total) by mouth daily as needed for erectile dysfunction.    Dispense:  10 tablet    Refill:  5    Patient Care Team: Tammy Rachel Brought, MD as PCP - General (Family Medicine) Tammy Rachel Brought, MD as PCP - Attributed  Electronically signed by: Madelin Rachel Brought, MD 10/09/2023 10:45 AM

## 2023-10-29 ENCOUNTER — Encounter (HOSPITAL_COMMUNITY): Payer: Self-pay | Admitting: Psychiatry

## 2023-10-29 ENCOUNTER — Telehealth (HOSPITAL_BASED_OUTPATIENT_CLINIC_OR_DEPARTMENT_OTHER): Admitting: Psychiatry

## 2023-10-29 VITALS — Wt 131.0 lb

## 2023-10-29 DIAGNOSIS — R413 Other amnesia: Secondary | ICD-10-CM

## 2023-10-29 DIAGNOSIS — F4310 Post-traumatic stress disorder, unspecified: Secondary | ICD-10-CM | POA: Diagnosis not present

## 2023-10-29 DIAGNOSIS — F331 Major depressive disorder, recurrent, moderate: Secondary | ICD-10-CM | POA: Diagnosis not present

## 2023-10-29 MED ORDER — FLUOXETINE HCL 10 MG PO CAPS: ORAL_CAPSULE | ORAL | 1 refills | Status: DC

## 2023-10-29 MED ORDER — TRAZODONE HCL 100 MG PO TABS: 50.0000 mg | ORAL_TABLET | Freq: Every evening | ORAL | 0 refills | Status: DC | PRN
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
## 2023-11-14 ENCOUNTER — Telehealth: Payer: Self-pay | Admitting: Hematology and Oncology

## 2023-11-14 NOTE — Telephone Encounter (Signed)
 Scheduled appointments per 6/18 inbasket message from Dr.Iruku. Talked with the patient and he is aware of the made appointments.

## 2023-12-02 ENCOUNTER — Other Ambulatory Visit: Payer: Self-pay

## 2023-12-02 DIAGNOSIS — D509 Iron deficiency anemia, unspecified: Secondary | ICD-10-CM

## 2023-12-03 ENCOUNTER — Inpatient Hospital Stay: Attending: Hematology and Oncology

## 2023-12-03 DIAGNOSIS — F4310 Post-traumatic stress disorder, unspecified: Secondary | ICD-10-CM | POA: Diagnosis not present

## 2023-12-03 DIAGNOSIS — Z7984 Long term (current) use of oral hypoglycemic drugs: Secondary | ICD-10-CM | POA: Diagnosis not present

## 2023-12-03 DIAGNOSIS — D509 Iron deficiency anemia, unspecified: Secondary | ICD-10-CM | POA: Diagnosis present

## 2023-12-03 DIAGNOSIS — R5383 Other fatigue: Secondary | ICD-10-CM | POA: Diagnosis not present

## 2023-12-03 DIAGNOSIS — Z833 Family history of diabetes mellitus: Secondary | ICD-10-CM | POA: Insufficient documentation

## 2023-12-03 DIAGNOSIS — Z8249 Family history of ischemic heart disease and other diseases of the circulatory system: Secondary | ICD-10-CM | POA: Insufficient documentation

## 2023-12-03 DIAGNOSIS — Z9049 Acquired absence of other specified parts of digestive tract: Secondary | ICD-10-CM | POA: Insufficient documentation

## 2023-12-03 DIAGNOSIS — Z7982 Long term (current) use of aspirin: Secondary | ICD-10-CM | POA: Diagnosis not present

## 2023-12-03 DIAGNOSIS — Z86711 Personal history of pulmonary embolism: Secondary | ICD-10-CM | POA: Diagnosis not present

## 2023-12-03 DIAGNOSIS — I10 Essential (primary) hypertension: Secondary | ICD-10-CM | POA: Insufficient documentation

## 2023-12-03 DIAGNOSIS — E119 Type 2 diabetes mellitus without complications: Secondary | ICD-10-CM | POA: Insufficient documentation

## 2023-12-03 DIAGNOSIS — Z79899 Other long term (current) drug therapy: Secondary | ICD-10-CM | POA: Insufficient documentation

## 2023-12-03 LAB — CBC WITH DIFFERENTIAL (CANCER CENTER ONLY)
Abs Immature Granulocytes: 0.02 K/uL (ref 0.00–0.07)
Basophils Absolute: 0.1 K/uL (ref 0.0–0.1)
Basophils Relative: 1 %
Eosinophils Absolute: 0.1 K/uL (ref 0.0–0.5)
Eosinophils Relative: 2 %
HCT: 43.1 % (ref 39.0–52.0)
Hemoglobin: 14.9 g/dL (ref 13.0–17.0)
Immature Granulocytes: 0 %
Lymphocytes Relative: 29 %
Lymphs Abs: 1.6 K/uL (ref 0.7–4.0)
MCH: 30.4 pg (ref 26.0–34.0)
MCHC: 34.6 g/dL (ref 30.0–36.0)
MCV: 88 fL (ref 80.0–100.0)
Monocytes Absolute: 0.6 K/uL (ref 0.1–1.0)
Monocytes Relative: 11 %
Neutro Abs: 3.2 K/uL (ref 1.7–7.7)
Neutrophils Relative %: 57 %
Platelet Count: 241 K/uL (ref 150–400)
RBC: 4.9 MIL/uL (ref 4.22–5.81)
RDW: 12.7 % (ref 11.5–15.5)
WBC Count: 5.7 K/uL (ref 4.0–10.5)
nRBC: 0 % (ref 0.0–0.2)

## 2023-12-04 ENCOUNTER — Inpatient Hospital Stay: Admitting: Hematology and Oncology

## 2023-12-04 DIAGNOSIS — D509 Iron deficiency anemia, unspecified: Secondary | ICD-10-CM

## 2023-12-04 LAB — IRON AND IRON BINDING CAPACITY (CC-WL,HP ONLY)
Iron: 85 ug/dL (ref 45–182)
Saturation Ratios: 17 % — ABNORMAL LOW (ref 17.9–39.5)
TIBC: 511 ug/dL — ABNORMAL HIGH (ref 250–450)
UIBC: 426 ug/dL — ABNORMAL HIGH (ref 117–376)

## 2023-12-04 LAB — FERRITIN: Ferritin: 29 ng/mL (ref 24–336)
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
## 2023-12-07 ENCOUNTER — Telehealth: Payer: Self-pay | Admitting: Hematology and Oncology

## 2023-12-07 ENCOUNTER — Telehealth: Payer: Self-pay | Admitting: Pharmacy Technician

## 2023-12-07 DIAGNOSIS — D509 Iron deficiency anemia, unspecified: Secondary | ICD-10-CM | POA: Insufficient documentation

## 2023-12-07 NOTE — Telephone Encounter (Signed)
 Patient referred to infusion pharmacy team for ambulatory infusion of IV iron .  Insurance - Brewing technologist of care - Site of care: [facility] Dx code - D50.9 IV Iron  Therapy - Venofer  200 mg x 5  Infusion appointments - Scheduling team will schedule patient as soon as possible.   Thank you,  Norton Blush, PharmD, BCSCP Pharmacist [facility]

## 2023-12-07 NOTE — Telephone Encounter (Signed)
 Auth Submission: NO AUTH NEEDED Site of care: Site of care: [facility] Payer: BCBS Medication & CPT/J Code(s) submitted: Venofer  (Iron  Sucrose) J1756 Diagnosis Code: D50.9 Route of submission (phone, fax, portal):  Phone # Fax # Auth type: Buy/Bill PB Units/visits requested: 5 DOSES Reference number:  Approval from: 12/07/23 to 02/17/24

## 2023-12-10 ENCOUNTER — Encounter (HOSPITAL_COMMUNITY): Payer: Self-pay | Admitting: Psychiatry

## 2023-12-10 ENCOUNTER — Telehealth (HOSPITAL_BASED_OUTPATIENT_CLINIC_OR_DEPARTMENT_OTHER): Admitting: Psychiatry

## 2023-12-10 VITALS — Wt 131.0 lb

## 2023-12-10 DIAGNOSIS — R413 Other amnesia: Secondary | ICD-10-CM | POA: Diagnosis not present

## 2023-12-10 DIAGNOSIS — F331 Major depressive disorder, recurrent, moderate: Secondary | ICD-10-CM

## 2023-12-10 DIAGNOSIS — F4310 Post-traumatic stress disorder, unspecified: Secondary | ICD-10-CM

## 2023-12-10 MED ORDER — FLUOXETINE HCL 20 MG PO CAPS: ORAL_CAPSULE | ORAL | 1 refills | Status: DC
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
## 2023-12-22 ENCOUNTER — Ambulatory Visit

## 2023-12-22 VITALS — BP 118/75 | HR 76 | Temp 98.3°F | Resp 18 | Ht 66.0 in | Wt 132.6 lb

## 2023-12-22 DIAGNOSIS — D509 Iron deficiency anemia, unspecified: Secondary | ICD-10-CM | POA: Diagnosis not present

## 2023-12-22 MED ORDER — IRON SUCROSE 20 MG/ML IV SOLN
200.0000 mg | Freq: Once | INTRAVENOUS | Status: AC
  Administered 2023-12-22: 200 mg via INTRAVENOUS
  Filled 2023-12-22: qty 10

## 2023-12-22 NOTE — Patient Instructions (Signed)

## 2023-12-22 NOTE — Progress Notes (Signed)
 Diagnosis: Iron Deficiency Anemia  Provider:  Chilton Greathouse MD  Procedure: IV Push  IV Type: Peripheral, IV Location: L Antecubital  Venofer (Iron Sucrose), Dose: 200 mg  Post Infusion IV Care: Observation period completed and Peripheral IV Discontinued  Discharge: Condition: Good, Destination: Home . AVS Provided  Performed by:  Adriana Mccallum, RN

## 2023-12-29 ENCOUNTER — Ambulatory Visit

## 2023-12-29 VITALS — BP 135/71 | HR 72 | Temp 98.1°F | Resp 18 | Ht 66.0 in | Wt 131.2 lb

## 2023-12-29 DIAGNOSIS — D509 Iron deficiency anemia, unspecified: Secondary | ICD-10-CM

## 2023-12-29 MED ORDER — IRON SUCROSE 20 MG/ML IV SOLN
200.0000 mg | Freq: Once | INTRAVENOUS | Status: AC
  Administered 2023-12-29: 200 mg via INTRAVENOUS
  Filled 2023-12-29: qty 10

## 2023-12-29 NOTE — Progress Notes (Signed)
 Diagnosis: Iron Deficiency Anemia  Provider:  Chilton Greathouse MD  Procedure: IV Push  IV Type: Peripheral, IV Location: L Antecubital  Venofer (Iron Sucrose), Dose: 200 mg  Post Infusion IV Care: Observation period completed and Peripheral IV Discontinued  Discharge: Condition: Good, Destination: Home . AVS Provided  Performed by:  Adriana Mccallum, RN

## 2024-01-05 ENCOUNTER — Ambulatory Visit

## 2024-01-07 ENCOUNTER — Ambulatory Visit (INDEPENDENT_AMBULATORY_CARE_PROVIDER_SITE_OTHER)

## 2024-01-07 VITALS — BP 110/67 | HR 82 | Temp 98.2°F | Resp 16 | Ht 66.0 in | Wt 131.0 lb

## 2024-01-07 DIAGNOSIS — D509 Iron deficiency anemia, unspecified: Secondary | ICD-10-CM | POA: Diagnosis not present

## 2024-01-07 MED ORDER — IRON SUCROSE 20 MG/ML IV SOLN
200.0000 mg | Freq: Once | INTRAVENOUS | Status: AC
  Administered 2024-01-07: 200 mg via INTRAVENOUS
  Filled 2024-01-07: qty 10

## 2024-01-07 MED ORDER — IRON SUCROSE 200 MG IVPB - SIMPLE MED: 200.0000 mg | Freq: Once | Status: DC

## 2024-01-07 MED ORDER — SODIUM CHLORIDE 0.9 % IV BOLUS (SEPSIS)
250.0000 mL | Freq: Once | INTRAVENOUS | Status: DC
  Filled 2024-01-07: qty 250

## 2024-01-07 NOTE — Progress Notes (Signed)
 Diagnosis: Iron Deficiency Anemia  Provider:  Chilton Greathouse MD  Procedure: IV Push  IV Type: Peripheral, IV Location: R Antecubital  Venofer (Iron Sucrose), Dose: 200 mg  Post Infusion IV Care: Observation period completed and Peripheral IV Discontinued  Discharge: Condition: Good, Destination: Home . AVS Declined  Performed by:  Loney Hering, LPN

## 2024-01-12 ENCOUNTER — Ambulatory Visit

## 2024-01-19 ENCOUNTER — Ambulatory Visit

## 2024-01-21 ENCOUNTER — Telehealth (HOSPITAL_COMMUNITY): Admitting: Psychiatry

## 2024-01-21 ENCOUNTER — Encounter (HOSPITAL_COMMUNITY): Payer: Self-pay | Admitting: Psychiatry

## 2024-01-21 ENCOUNTER — Ambulatory Visit

## 2024-01-21 VITALS — Wt 131.0 lb

## 2024-01-21 DIAGNOSIS — F4310 Post-traumatic stress disorder, unspecified: Secondary | ICD-10-CM

## 2024-01-21 DIAGNOSIS — F331 Major depressive disorder, recurrent, moderate: Secondary | ICD-10-CM

## 2024-01-21 DIAGNOSIS — R413 Other amnesia: Secondary | ICD-10-CM

## 2024-01-21 MED ORDER — FLUOXETINE HCL 20 MG PO CAPS: ORAL_CAPSULE | ORAL | 1 refills | Status: AC
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
## 2024-02-01 ENCOUNTER — Ambulatory Visit

## 2024-02-03 ENCOUNTER — Ambulatory Visit

## 2024-02-03 VITALS — BP 124/77 | HR 82 | Temp 97.9°F | Resp 16 | Ht 66.0 in | Wt 133.0 lb

## 2024-02-03 DIAGNOSIS — D509 Iron deficiency anemia, unspecified: Secondary | ICD-10-CM | POA: Diagnosis not present

## 2024-02-03 MED ORDER — IRON SUCROSE 200 MG IVPB - SIMPLE MED
200.0000 mg | Freq: Once | Status: AC
  Administered 2024-02-03: 16:00:00 200 mg via INTRAVENOUS
  Filled 2024-02-03: qty 110

## 2024-02-03 NOTE — Progress Notes (Signed)
 Diagnosis:  Iron  Deficiency Anemia  Provider:  Praveen Mannam MD  Procedure: IV Infusion  IV Type: Peripheral, IV Location: L Antecubital   Venofer  (Iron  Sucrose), Dose: 200 mg  Infusion Start Time: 1548  Infusion Stop Time: 1604  Post Infusion IV Care: Observation period completed and Peripheral IV Discontinued  Discharge: Condition: Good, Destination: Home . AVS Provided  Performed by:  Maximiano JONELLE Pouch, LPN

## 2024-02-08 ENCOUNTER — Ambulatory Visit

## 2024-02-08 VITALS — BP 108/68 | HR 86 | Temp 97.9°F | Resp 16 | Ht 66.0 in | Wt 132.2 lb

## 2024-02-08 DIAGNOSIS — D509 Iron deficiency anemia, unspecified: Secondary | ICD-10-CM

## 2024-02-08 MED ORDER — IRON SUCROSE 200 MG IVPB - SIMPLE MED
200.0000 mg | Freq: Once | Status: AC
  Administered 2024-02-08: 200 mg via INTRAVENOUS
  Filled 2024-02-08: qty 110

## 2024-02-08 NOTE — Progress Notes (Signed)
 Diagnosis:  Iron  Deficiency Anemia  Provider:  Praveen Mannam MD  Procedure: IV Infusion  IV Type: Peripheral, IV Location: L Antecubital   Venofer  (Iron  Sucrose), Dose: 200 mg  Infusion Start Time: 1539  Infusion Stop Time: 1555  Post Infusion IV Care: Patient declined observation and Peripheral IV Discontinued  Discharge: Condition: Good, Destination: Home . AVS Provided  Performed by:  Maximiano JONELLE Pouch, LPN

## 2024-02-16 ENCOUNTER — Ambulatory Visit (INDEPENDENT_AMBULATORY_CARE_PROVIDER_SITE_OTHER)

## 2024-02-16 DIAGNOSIS — F331 Major depressive disorder, recurrent, moderate: Secondary | ICD-10-CM

## 2024-02-16 DIAGNOSIS — F4310 Post-traumatic stress disorder, unspecified: Secondary | ICD-10-CM
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
## 2024-02-29 NOTE — Progress Notes (Unsigned)
" ° °  THERAPIST PROGRESS NOTE  Session Time: 58 mins  Participation Level: Active  Behavioral Response: CasualAlertDysphoric  Type of Therapy: Individual Therapy  Treatment Goals addressed: STG: Ray Clark will participate in EMDR and other therapeutic techniques to process and resolve past trauma and reduce trauma reactivity from daily to no more than three times per week. STG: Ray Clark will participate in therapy to learn and develop skills to reduce the impact of the daily symptoms of depression from daily to no more than three times a week.   ProgressTowards Goals: Initial  Interventions: CBT, Motivational Interviewing.   Summary: Ray Clark is a 59 y.o. male who presents with a history of trauma and depression. Therapist greeted Ray Clark warmly and spent a few minutes checking in and discussing how things have been since the last session. Ray Clark presents for session alert and oriented; mood and affect were slightly dysphoric. Speech is clear and coherent at normal rate and tone. Engaged and cooperative in session. Therapist spent the first part of session creating treatment plan and goals. Therapist also spent time to assess management of behavioral symptoms and any safety concerns and current level of functioning. Ray Clark denies all safety concerns and continues to work towards goals.    Therapist spent the second part of session on psychoeducation, skill development and therapeutic interventions in session. Ray Clark shared feelings of social anxiety just getting out of the house.  He also reported hyperstartle while at pooja in the temple. Therapist used Polyvagal techniques to help Ray Clark reduce intensity of anxiety symptoms. Therapist did psychoeducation around   stress response and understanding the adrenaline pathways. He responded well to the interventions and shared understanding his symptoms and reported a reduction in anxiety intensity when he practiced in session. A wrap up meditation activity  was done at the end of the session and Ray Clark responded well to it.   Suicidal/Homicidal: No without intent/plan  Therapist Response: Therapist used Motivational Interviewing to facilitate discussion, elicit pertinent information and summarized thoughts and feelings for clarity and accuracy. Used supportive language and validation to provide a safe space to process thoughts and feelings. Therapist used Polyvagal techniques and CBT in session as therapeutic techniques to address presenting concerns. Therapist noted a decrease in symptoms visibly as Ray Clark appeared more peppy and less dysphoric after practicing the techniques.  Therapist reviewed and summarized what was discussed in session today and asked Ray Clark if there were any questions and provided clarity as needed. No safety concerns were noted during session and SI/HI/AVH were not present. Homework was assigned. Therapist wrapped up with a mindfulness based activity and a follow up meeting time was scheduled.   Plan: Return again in 2 weeks.  Diagnosis: PTSD (post-traumatic stress disorder)  MDD (major depressive disorder), recurrent episode, moderate (HCC)  Collaboration of Care: Medication Management AEB chart review.  Patient/Guardian was advised Release of Information must be obtained prior to any record release in order to collaborate their care with an outside provider. Patient/Guardian was advised if they have not already done so to contact the registration department to sign all necessary forms in order for us  to release information regarding their care.   Consent: Patient/Guardian gives verbal consent for treatment and assignment of benefits for services provided during this visit. Patient/Guardian expressed understanding and agreed to proceed.   Ray Clark Carrie 03/01/2024  "

## 2024-03-01 ENCOUNTER — Ambulatory Visit

## 2024-03-01 DIAGNOSIS — F4310 Post-traumatic stress disorder, unspecified: Secondary | ICD-10-CM

## 2024-03-01 DIAGNOSIS — F331 Major depressive disorder, recurrent, moderate: Secondary | ICD-10-CM | POA: Insufficient documentation

## 2024-03-17 ENCOUNTER — Ambulatory Visit (INDEPENDENT_AMBULATORY_CARE_PROVIDER_SITE_OTHER)

## 2024-03-17 DIAGNOSIS — F4310 Post-traumatic stress disorder, unspecified: Secondary | ICD-10-CM

## 2024-03-17 DIAGNOSIS — F331 Major depressive disorder, recurrent, moderate: Secondary | ICD-10-CM
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
## 2024-04-05 ENCOUNTER — Ambulatory Visit

## 2024-04-21 ENCOUNTER — Telehealth (HOSPITAL_COMMUNITY): Admitting: Psychiatry

## 2024-06-08 ENCOUNTER — Inpatient Hospital Stay: Attending: Hematology and Oncology

## 2024-06-08 ENCOUNTER — Inpatient Hospital Stay: Admitting: Hematology and Oncology
